# Patient Record
Sex: Female | Born: 1950 | Race: Black or African American | Hispanic: No | Marital: Single | Smoking: Never smoker
Health system: Southern US, Community
[De-identification: ages and names within clinical notes are randomized; demographics above are authoritative.]

## PROBLEM LIST (undated history)

## (undated) DIAGNOSIS — I7121 Aneurysm of the ascending aorta, without rupture: Secondary | ICD-10-CM

## (undated) DIAGNOSIS — E119 Type 2 diabetes mellitus without complications: Secondary | ICD-10-CM

## (undated) DIAGNOSIS — N189 Chronic kidney disease, unspecified: Secondary | ICD-10-CM

## (undated) DIAGNOSIS — Z972 Presence of dental prosthetic device (complete) (partial): Secondary | ICD-10-CM

## (undated) DIAGNOSIS — N2581 Secondary hyperparathyroidism of renal origin: Secondary | ICD-10-CM

## (undated) DIAGNOSIS — I639 Cerebral infarction, unspecified: Secondary | ICD-10-CM

## (undated) DIAGNOSIS — N186 End stage renal disease: Secondary | ICD-10-CM

## (undated) DIAGNOSIS — I10 Essential (primary) hypertension: Secondary | ICD-10-CM

## (undated) DIAGNOSIS — J811 Chronic pulmonary edema: Secondary | ICD-10-CM

## (undated) DIAGNOSIS — I509 Heart failure, unspecified: Secondary | ICD-10-CM

## (undated) DIAGNOSIS — J189 Pneumonia, unspecified organism: Secondary | ICD-10-CM

## (undated) DIAGNOSIS — I679 Cerebrovascular disease, unspecified: Secondary | ICD-10-CM

## (undated) DIAGNOSIS — E785 Hyperlipidemia, unspecified: Secondary | ICD-10-CM

## (undated) HISTORY — DX: Type 2 diabetes mellitus without complications: E11.9

## (undated) HISTORY — PX: ABDOMINAL HYSTERECTOMY: SHX81

## (undated) HISTORY — PX: COLONOSCOPY: SHX174

## (undated) HISTORY — DX: Essential (primary) hypertension: I10

---

## 2006-12-26 ENCOUNTER — Emergency Department: Payer: Self-pay | Admitting: Emergency Medicine

## 2006-12-26 ENCOUNTER — Other Ambulatory Visit: Payer: Self-pay

## 2012-11-26 DIAGNOSIS — E119 Type 2 diabetes mellitus without complications: Secondary | ICD-10-CM | POA: Insufficient documentation

## 2012-11-26 DIAGNOSIS — I679 Cerebrovascular disease, unspecified: Secondary | ICD-10-CM | POA: Insufficient documentation

## 2013-12-29 DIAGNOSIS — E785 Hyperlipidemia, unspecified: Secondary | ICD-10-CM | POA: Insufficient documentation

## 2014-12-12 ENCOUNTER — Encounter: Payer: Self-pay | Admitting: Podiatry

## 2014-12-12 ENCOUNTER — Ambulatory Visit (INDEPENDENT_AMBULATORY_CARE_PROVIDER_SITE_OTHER): Payer: No Typology Code available for payment source

## 2014-12-12 ENCOUNTER — Ambulatory Visit (INDEPENDENT_AMBULATORY_CARE_PROVIDER_SITE_OTHER): Payer: No Typology Code available for payment source | Admitting: Podiatry

## 2014-12-12 VITALS — BP 137/84 | HR 100 | Resp 16 | Ht 67.0 in | Wt 185.0 lb

## 2014-12-12 DIAGNOSIS — M79605 Pain in left leg: Secondary | ICD-10-CM | POA: Diagnosis not present

## 2014-12-12 DIAGNOSIS — E119 Type 2 diabetes mellitus without complications: Secondary | ICD-10-CM

## 2014-12-12 DIAGNOSIS — B351 Tinea unguium: Secondary | ICD-10-CM | POA: Diagnosis not present

## 2014-12-12 NOTE — Progress Notes (Signed)
   Subjective:    Patient ID: Dorothy Walton, female    DOB: Oct 21, 1950, 64 y.o.   MRN: [id number]  HPI The left 5th toenail is really long and thick. Can not cut it , i am diabetic and my last a1c was about a 12    Review of Systems  Endocrine:       Diabetes        Objective:   Physical Exam: I have reviewed her past medical history medications out of the surgery social history and review of systems. Pulses are strongly palpable bilateral. Neurologic system is intact percent once the monofilament. Deep tendon reflexes are intact bilateral muscle strength is 5 over 5 dorsiflexion plantar flexors and inverters everters on his musculature is intact. Orthopedic evaluation was resulted was distal to the ankle for range of motion or crepitation. Cutaneous evaluation inserted supple well-hydrated cutis thick yellow dystrophic clinically mycotic nail digit left foot is present. This is painful on palpation as well as debridement. The nail measures greater than 1 inch in length.        Assessment & Plan:  Assessment: Nail dystrophy with onychomycosis fifth digit left foot. Diabetes mellitus without complications.  Plan: Debridement of nails 1 through 5 left foot

## 2015-02-17 DIAGNOSIS — Z789 Other specified health status: Secondary | ICD-10-CM | POA: Insufficient documentation

## 2016-05-01 ENCOUNTER — Encounter: Payer: Self-pay | Admitting: Podiatry

## 2016-05-01 ENCOUNTER — Ambulatory Visit (INDEPENDENT_AMBULATORY_CARE_PROVIDER_SITE_OTHER): Payer: Managed Care, Other (non HMO) | Admitting: Podiatry

## 2016-05-01 DIAGNOSIS — B351 Tinea unguium: Secondary | ICD-10-CM | POA: Diagnosis not present

## 2016-05-01 DIAGNOSIS — M79676 Pain in unspecified toe(s): Secondary | ICD-10-CM

## 2016-05-02 NOTE — Progress Notes (Signed)
She presents today with a chief complaint of painful fifth digital nail left foot. She states that should've Q remove it last time permanently but I thought it would grow back better.  Objective: Vital signs are stable she is alert and oriented 3 states that her hemoglobin A1c has recently been a 10. Pulses remain palpable fifth digit left foot demonstrates a very thick discolored painful hard nail left as all the other nails appear to be normal. No signs of open wounds.  Assessment: Pain limb secondary to onychomycosis fifth digit left foot.  Plan: Debridement of toenails 1 through 5 left foot.

## 2016-11-14 ENCOUNTER — Ambulatory Visit: Payer: Managed Care, Other (non HMO) | Admitting: Podiatry

## 2016-11-25 ENCOUNTER — Ambulatory Visit (INDEPENDENT_AMBULATORY_CARE_PROVIDER_SITE_OTHER): Payer: Medicare HMO | Admitting: Podiatry

## 2016-11-25 DIAGNOSIS — E119 Type 2 diabetes mellitus without complications: Secondary | ICD-10-CM

## 2016-11-25 DIAGNOSIS — B351 Tinea unguium: Secondary | ICD-10-CM | POA: Diagnosis not present

## 2016-11-25 DIAGNOSIS — M79676 Pain in unspecified toe(s): Secondary | ICD-10-CM | POA: Diagnosis not present

## 2016-11-25 NOTE — Progress Notes (Signed)
Complaint:  Visit Type: Patient returns to my office for continued preventative foot care services. Complaint: Patient states" my nails have grown long and thick and become painful to walk and wear shoes" Patient has been diagnosed with DM with neuropathy. The patient presents for preventative foot care services. No changes to ROS  Podiatric Exam: Vascular: dorsalis pedis and posterior tibial pulses are palpable bilateral. Capillary return is immediate. Temperature gradient is WNL. Skin turgor WNL  Sensorium: Diminished  Semmes Weinstein monofilament test. Normal tactile sensation bilaterally. Nail Exam: Pt has thick disfigured discolored nails with subungual debris noted fifth toenail left foot. Ulcer Exam: There is no evidence of ulcer or pre-ulcerative changes or infection. Orthopedic Exam: Muscle tone and strength are WNL. No limitations in general ROM. No crepitus or effusions noted. Foot type and digits show no abnormalities. Bony prominences are unremarkable. Skin: No Porokeratosis. No infection or ulcers  Diagnosis:  Onychomycosis   Treatment & Plan Procedures and Treatment: Consent by patient was obtained for treatment procedures. The patient understood the discussion of treatment and procedures well. All questions were answered thoroughly reviewed. Debridement of mycotic and hypertrophic toenails, 1 through 5 bilateral and clearing of subungual debris. No ulceration, no infection noted.  Return Visit-Office Procedure: Patient instructed to return to the office for a follow up visit 4 months for continued evaluation and treatment.    Gardiner Barefoot DPM

## 2017-03-31 ENCOUNTER — Ambulatory Visit: Payer: Medicare HMO | Admitting: Podiatry

## 2017-03-31 ENCOUNTER — Encounter: Payer: Self-pay | Admitting: Podiatry

## 2017-03-31 ENCOUNTER — Ambulatory Visit (INDEPENDENT_AMBULATORY_CARE_PROVIDER_SITE_OTHER): Payer: Medicare HMO | Admitting: Podiatry

## 2017-03-31 VITALS — BP 130/75 | HR 68 | Resp 16

## 2017-03-31 DIAGNOSIS — M79676 Pain in unspecified toe(s): Secondary | ICD-10-CM | POA: Diagnosis not present

## 2017-03-31 DIAGNOSIS — E119 Type 2 diabetes mellitus without complications: Secondary | ICD-10-CM

## 2017-03-31 DIAGNOSIS — B351 Tinea unguium: Secondary | ICD-10-CM | POA: Diagnosis not present

## 2017-03-31 NOTE — Progress Notes (Signed)
Complaint:  Visit Type: Patient returns to my office for continued preventative foot care services. Complaint: Patient states" my nails have grown long and thick and become painful to walk and wear shoes" Patient has been diagnosed with DM with neuropathy. The patient presents for preventative foot care services. No changes to ROS  Podiatric Exam: Vascular: dorsalis pedis and posterior tibial pulses are palpable bilateral. Capillary return is immediate. Temperature gradient is WNL. Skin turgor WNL  Sensorium: Diminished  Semmes Weinstein monofilament test. Normal tactile sensation bilaterally. Nail Exam: Pt has thick disfigured discolored nails with subungual debris noted fifth toenail left foot. Ulcer Exam: There is no evidence of ulcer or pre-ulcerative changes or infection. Orthopedic Exam: Muscle tone and strength are WNL. No limitations in general ROM. No crepitus or effusions noted. Foot type and digits show no abnormalities. Bony prominences are unremarkable. Skin: No Porokeratosis. No infection or ulcers  Diagnosis:  Onychomycosis   Treatment & Plan Procedures and Treatment: Consent by patient was obtained for treatment procedures. The patient understood the discussion of treatment and procedures well. All questions were answered thoroughly reviewed. Debridement of mycotic and hypertrophic toenails, 1 through 5 bilateral and clearing of subungual debris. No ulceration, no infection noted.  Return Visit-Office Procedure: Patient instructed to return to the office for a follow up visit 4 months for continued evaluation and treatment.    Gardiner Barefoot DPM

## 2017-07-31 ENCOUNTER — Ambulatory Visit: Payer: Medicare HMO | Admitting: Podiatry

## 2017-08-25 ENCOUNTER — Encounter: Payer: Self-pay | Admitting: Podiatry

## 2017-08-25 ENCOUNTER — Ambulatory Visit (INDEPENDENT_AMBULATORY_CARE_PROVIDER_SITE_OTHER): Payer: Medicare HMO | Admitting: Podiatry

## 2017-08-25 DIAGNOSIS — E119 Type 2 diabetes mellitus without complications: Secondary | ICD-10-CM

## 2017-08-25 DIAGNOSIS — M79676 Pain in unspecified toe(s): Secondary | ICD-10-CM

## 2017-08-25 DIAGNOSIS — B351 Tinea unguium: Secondary | ICD-10-CM | POA: Diagnosis not present

## 2017-08-25 NOTE — Progress Notes (Signed)
Complaint:  Visit Type: Patient returns to my office for continued preventative foot care services. Complaint: Patient states" my nails have grown long and thick and become painful to walk and wear shoes" Patient has been diagnosed with DM with neuropathy. The patient presents for preventative foot care services. No changes to ROS  Podiatric Exam: Vascular: dorsalis pedis and posterior tibial pulses are palpable bilateral. Capillary return is immediate. Temperature gradient is WNL. Skin turgor WNL  Sensorium: Diminished  Semmes Weinstein monofilament test. Normal tactile sensation bilaterally. Nail Exam: Pt has thick disfigured discolored nails with subungual debris especially fifth toenail left foot. Ulcer Exam: There is no evidence of ulcer or pre-ulcerative changes or infection. Orthopedic Exam: Muscle tone and strength are WNL. No limitations in general ROM. No crepitus or effusions noted. Adducto-varus fifth toes  B/L Bony prominences are unremarkable. Skin: No Porokeratosis. No infection or ulcers  Diagnosis:  Onychomycosis   Treatment & Plan Procedures and Treatment: Consent by patient was obtained for treatment procedures. The patient understood the discussion of treatment and procedures well. All questions were answered thoroughly reviewed. Debridement of mycotic and hypertrophic toenails, 1 through 5 bilateral and clearing of subungual debris. No ulceration, no infection noted. To consider nail surgery fifth toenail. Return Visit-Office Procedure: Patient instructed to return to the office for a follow up visit 3 months for continued evaluation and treatment.    Gardiner Barefoot DPM

## 2017-11-24 ENCOUNTER — Ambulatory Visit: Payer: Medicare HMO | Admitting: Podiatry

## 2017-12-08 ENCOUNTER — Ambulatory Visit: Payer: Medicare HMO | Admitting: Podiatry

## 2017-12-24 ENCOUNTER — Other Ambulatory Visit: Payer: Self-pay

## 2017-12-24 ENCOUNTER — Emergency Department
Admission: EM | Admit: 2017-12-24 | Discharge: 2017-12-25 | Disposition: A | Discharge status: Home / Self Care | Payer: Medicare HMO | Attending: Emergency Medicine | Admitting: Emergency Medicine

## 2017-12-24 ENCOUNTER — Emergency Department: Payer: Medicare HMO

## 2017-12-24 DIAGNOSIS — Z7982 Long term (current) use of aspirin: Secondary | ICD-10-CM | POA: Diagnosis not present

## 2017-12-24 DIAGNOSIS — I10 Essential (primary) hypertension: Secondary | ICD-10-CM | POA: Insufficient documentation

## 2017-12-24 DIAGNOSIS — Z7984 Long term (current) use of oral hypoglycemic drugs: Secondary | ICD-10-CM | POA: Insufficient documentation

## 2017-12-24 DIAGNOSIS — E119 Type 2 diabetes mellitus without complications: Secondary | ICD-10-CM | POA: Insufficient documentation

## 2017-12-24 DIAGNOSIS — R4182 Altered mental status, unspecified: Secondary | ICD-10-CM | POA: Insufficient documentation

## 2017-12-24 DIAGNOSIS — R5383 Other fatigue: Secondary | ICD-10-CM | POA: Diagnosis present

## 2017-12-24 LAB — GLUCOSE, CAPILLARY: GLUCOSE-CAPILLARY: 220 mg/dL — AB (ref 65–99)

## 2017-12-24 MED ORDER — CARVEDILOL 25 MG PO TABS
25.0000 mg | ORAL_TABLET | Freq: Two times a day (BID) | ORAL | Status: DC
  Administered 2017-12-25: 25 mg via ORAL

## 2017-12-24 NOTE — ED Notes (Signed)
Patient takes Coreg 25mg  bid, Amlodipine 10mg  daily, Chlorthalidone 25mg  daily for BP.
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
## 2017-12-25 ENCOUNTER — Ambulatory Visit (INDEPENDENT_AMBULATORY_CARE_PROVIDER_SITE_OTHER): Payer: Medicare HMO | Admitting: Podiatry

## 2017-12-25 ENCOUNTER — Encounter: Payer: Self-pay | Admitting: Podiatry

## 2017-12-25 DIAGNOSIS — E119 Type 2 diabetes mellitus without complications: Secondary | ICD-10-CM | POA: Diagnosis not present

## 2017-12-25 DIAGNOSIS — B351 Tinea unguium: Secondary | ICD-10-CM | POA: Diagnosis not present

## 2017-12-25 DIAGNOSIS — M79676 Pain in unspecified toe(s): Secondary | ICD-10-CM | POA: Diagnosis not present

## 2017-12-25 MED ORDER — CARVEDILOL 25 MG PO TABS
ORAL_TABLET | ORAL | Status: AC
  Administered 2017-12-25: 25 mg via ORAL
  Filled 2017-12-25: qty 1

## 2017-12-25 NOTE — Progress Notes (Signed)
Complaint:  Visit Type: Patient returns to my office for continued preventative foot care services. Complaint: Patient states" my nails have grown long and thick and become painful to walk and wear shoes" Patient has been diagnosed with DM with neuropathy. The patient presents for preventative foot care services. No changes to ROS  Podiatric Exam: Vascular: dorsalis pedis and posterior tibial pulses are palpable bilateral. Capillary return is immediate. Temperature gradient is WNL. Skin turgor WNL  Sensorium: Diminished  Semmes Weinstein monofilament test. Normal tactile sensation bilaterally. Nail Exam: Pt has thick disfigured discolored nails with subungual debris especially fifth toenail left foot. Ulcer Exam: There is no evidence of ulcer or pre-ulcerative changes or infection. Orthopedic Exam: Muscle tone and strength are WNL. No limitations in general ROM. No crepitus or effusions noted. Adducto-varus fifth toes  B/L Bony prominences are unremarkable. Skin: No Porokeratosis. No infection or ulcers  Diagnosis:  Onychomycosis   Treatment & Plan Procedures and Treatment: Consent by patient was obtained for treatment procedures. The patient understood the discussion of treatment and procedures well. All questions were answered thoroughly reviewed. Debridement of mycotic and hypertrophic toenails, 1 through 5 bilateral and clearing of subungual debris. No ulceration, no infection noted. To consider nail surgery fifth toenail. Return Visit-Office Procedure: Patient instructed to return to the office for a follow up visit 3 months for continued evaluation and treatment.    Gardiner Barefoot DPM
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
## 2018-03-02 ENCOUNTER — Ambulatory Visit: Payer: Medicare HMO | Admitting: Podiatry

## 2018-03-09 ENCOUNTER — Ambulatory Visit (INDEPENDENT_AMBULATORY_CARE_PROVIDER_SITE_OTHER): Payer: Medicare HMO | Admitting: Podiatry

## 2018-03-09 ENCOUNTER — Encounter: Payer: Self-pay | Admitting: Podiatry

## 2018-03-09 DIAGNOSIS — B351 Tinea unguium: Secondary | ICD-10-CM | POA: Diagnosis not present

## 2018-03-09 DIAGNOSIS — M79676 Pain in unspecified toe(s): Secondary | ICD-10-CM | POA: Diagnosis not present

## 2018-03-09 DIAGNOSIS — E119 Type 2 diabetes mellitus without complications: Secondary | ICD-10-CM

## 2018-03-09 NOTE — Progress Notes (Signed)
Complaint:  Visit Type: Patient returns to my office for continued preventative foot care services. Complaint: Patient states" my nails have grown long and thick and become painful to walk and wear shoes" Patient has been diagnosed with DM with neuropathy. The patient presents for preventative foot care services. No changes to ROS  Podiatric Exam: Vascular: dorsalis pedis and posterior tibial pulses are palpable bilateral. Capillary return is immediate. Temperature gradient is WNL. Skin turgor WNL  Sensorium: Diminished  Semmes Weinstein monofilament test. Normal tactile sensation bilaterally. Nail Exam: Pt has thick disfigured discolored nails with subungual debris especially fifth toenail left foot. Ulcer Exam: There is no evidence of ulcer or pre-ulcerative changes or infection. Orthopedic Exam: Muscle tone and strength are WNL. No limitations in general ROM. No crepitus or effusions noted. Adducto-varus fifth toes  B/L Bony prominences are unremarkable. Skin: No Porokeratosis. No infection or ulcers  Diagnosis:  Onychomycosis   Treatment & Plan Procedures and Treatment: Consent by patient was obtained for treatment procedures. The patient understood the discussion of treatment and procedures well. All questions were answered thoroughly reviewed. Debridement of mycotic and hypertrophic toenails, 1 through 5 bilateral and clearing of subungual debris. No ulceration, no infection noted.  Return Visit-Office Procedure: Patient instructed to return to the office for a follow up visit 3 months for continued evaluation and treatment.    Gardiner Barefoot DPM

## 2018-06-11 ENCOUNTER — Ambulatory Visit: Payer: Medicare HMO | Admitting: Podiatry

## 2018-06-25 ENCOUNTER — Ambulatory Visit (INDEPENDENT_AMBULATORY_CARE_PROVIDER_SITE_OTHER): Payer: Medicare HMO | Admitting: Podiatry

## 2018-06-25 ENCOUNTER — Encounter: Payer: Self-pay | Admitting: Podiatry

## 2018-06-25 DIAGNOSIS — M79676 Pain in unspecified toe(s): Secondary | ICD-10-CM | POA: Diagnosis not present

## 2018-06-25 DIAGNOSIS — B351 Tinea unguium: Secondary | ICD-10-CM | POA: Diagnosis not present

## 2018-06-25 DIAGNOSIS — E119 Type 2 diabetes mellitus without complications: Secondary | ICD-10-CM

## 2018-06-25 NOTE — Progress Notes (Signed)
Complaint:  Visit Type: Patient returns to my office for continued preventative foot care services. Complaint: Patient states" my nails have grown long and thick and become painful to walk and wear shoes" Patient has been diagnosed with DM with neuropathy. The patient presents for preventative foot care services. No changes to ROS  Podiatric Exam: Vascular: dorsalis pedis and posterior tibial pulses are palpable bilateral. Capillary return is immediate. Temperature gradient is WNL. Skin turgor WNL  Sensorium: Diminished  Semmes Weinstein monofilament test. Normal tactile sensation bilaterally. Nail Exam: Pt has thick disfigured discolored nails with subungual debris especially fifth toenail left foot. Ulcer Exam: There is no evidence of ulcer or pre-ulcerative changes or infection. Orthopedic Exam: Muscle tone and strength are WNL. No limitations in general ROM. No crepitus or effusions noted. Adducto-varus fifth toes  B/L Bony prominences are unremarkable. Skin: No Porokeratosis. No infection or ulcers  Diagnosis:  Onychomycosis   Treatment & Plan Procedures and Treatment: Consent by patient was obtained for treatment procedures. The patient understood the discussion of treatment and procedures well. All questions were answered thoroughly reviewed. Debridement of mycotic and hypertrophic toenails, 1 through 5 bilateral and clearing of subungual debris. No ulceration, no infection noted.  Return Visit-Office Procedure: Patient instructed to return to the office for a follow up visit 3 months for continued evaluation and treatment.    Gardiner Barefoot DPM

## 2018-09-24 ENCOUNTER — Ambulatory Visit (INDEPENDENT_AMBULATORY_CARE_PROVIDER_SITE_OTHER): Payer: Medicare HMO | Admitting: Podiatry

## 2018-09-24 ENCOUNTER — Encounter: Payer: Self-pay | Admitting: Podiatry

## 2018-09-24 DIAGNOSIS — M79676 Pain in unspecified toe(s): Secondary | ICD-10-CM

## 2018-09-24 DIAGNOSIS — E119 Type 2 diabetes mellitus without complications: Secondary | ICD-10-CM

## 2018-09-24 DIAGNOSIS — B351 Tinea unguium: Secondary | ICD-10-CM | POA: Diagnosis not present

## 2018-09-24 NOTE — Progress Notes (Signed)
Complaint:  Visit Type: Patient returns to my office for continued preventative foot care services. Complaint: Patient states" my nails have grown long and thick and become painful to walk and wear shoes" Patient has been diagnosed with DM with neuropathy. The patient presents for preventative foot care services. No changes to ROS  Podiatric Exam: Vascular: dorsalis pedis and posterior tibial pulses are palpable bilateral. Capillary return is immediate. Temperature gradient is WNL. Skin turgor WNL  Sensorium: Diminished  Semmes Weinstein monofilament test. Normal tactile sensation bilaterally. Nail Exam: Pt has thick disfigured discolored nails with subungual debris especially fifth toenail left foot. Ulcer Exam: There is no evidence of ulcer or pre-ulcerative changes or infection. Orthopedic Exam: Muscle tone and strength are WNL. No limitations in general ROM. No crepitus or effusions noted. Adducto-varus fifth toes  B/L Bony prominences are unremarkable. Skin: No Porokeratosis. No infection or ulcers  Diagnosis:  Onychomycosis   Treatment & Plan Procedures and Treatment: Consent by patient was obtained for treatment procedures. The patient understood the discussion of treatment and procedures well. All questions were answered thoroughly reviewed. Debridement of mycotic and hypertrophic toenails, 1 through 5 bilateral and clearing of subungual debris. No ulceration, no infection noted.  Return Visit-Office Procedure: Patient instructed to return to the office for a follow up visit 3 months for continued evaluation and treatment.    Gardiner Barefoot DPM

## 2018-09-28 ENCOUNTER — Encounter: Payer: Self-pay | Admitting: *Deleted

## 2018-09-28 ENCOUNTER — Other Ambulatory Visit: Payer: Self-pay

## 2018-09-29 ENCOUNTER — Encounter: Payer: Self-pay | Admitting: Anesthesiology

## 2018-10-01 NOTE — Discharge Instructions (Signed)

## 2018-10-05 ENCOUNTER — Encounter
Admission: RE | Disposition: A | Discharge status: Home / Self Care | Payer: Self-pay | Source: Home / Self Care | Attending: Ophthalmology

## 2018-10-05 ENCOUNTER — Ambulatory Visit
Admission: RE | Admit: 2018-10-05 | Discharge: 2018-10-05 | Disposition: A | Discharge status: Home / Self Care | Payer: Medicare HMO | Attending: Ophthalmology | Admitting: Ophthalmology

## 2018-10-05 DIAGNOSIS — E1136 Type 2 diabetes mellitus with diabetic cataract: Secondary | ICD-10-CM | POA: Diagnosis present

## 2018-10-05 DIAGNOSIS — Z5309 Procedure and treatment not carried out because of other contraindication: Secondary | ICD-10-CM | POA: Insufficient documentation

## 2018-10-05 HISTORY — DX: Presence of dental prosthetic device (complete) (partial): Z97.2

## 2018-10-05 HISTORY — DX: Cerebral infarction, unspecified: I63.9

## 2018-10-05 HISTORY — DX: Hyperlipidemia, unspecified: E78.5

## 2018-10-05 LAB — GLUCOSE, CAPILLARY: Glucose-Capillary: 103 mg/dL — ABNORMAL HIGH (ref 70–99)

## 2018-10-05 SURGERY — PHACOEMULSIFICATION, CATARACT, WITH IOL INSERTION
Anesthesia: Topical | Laterality: Left

## 2018-10-05 MED ORDER — TETRACAINE HCL 0.5 % OP SOLN: 1.0000 [drp] | OPHTHALMIC | Status: DC | PRN

## 2018-10-05 MED ORDER — ARMC OPHTHALMIC DILATING DROPS: 1.0000 "application " | OPHTHALMIC | Status: DC | PRN

## 2018-10-05 MED ORDER — LACTATED RINGERS IV SOLN: INTRAVENOUS | Status: DC

## 2018-10-05 SURGICAL SUPPLY — 16 items
CANNULA ANT/CHMB 27GA (MISCELLANEOUS) ×6 IMPLANT
DISSECTOR HYDRO NUCLEUS 50X22 (MISCELLANEOUS) ×3 IMPLANT
GLOVE SURG LX 7.5 STRW (GLOVE) ×2
GLOVE SURG LX STRL 7.5 STRW (GLOVE) ×1 IMPLANT
GLOVE SURG SYN 8.5  E (GLOVE) ×2
GLOVE SURG SYN 8.5 E (GLOVE) ×1 IMPLANT
GOWN STRL REUS W/ TWL LRG LVL3 (GOWN DISPOSABLE) ×2 IMPLANT
GOWN STRL REUS W/TWL LRG LVL3 (GOWN DISPOSABLE) ×4
MARKER SKIN DUAL TIP RULER LAB (MISCELLANEOUS) ×3 IMPLANT
PACK DR. KING ARMS (PACKS) ×3 IMPLANT
PACK EYE AFTER SURG (MISCELLANEOUS) ×3 IMPLANT
PACK OPTHALMIC (MISCELLANEOUS) ×3 IMPLANT
SYR 3ML LL SCALE MARK (SYRINGE) ×3 IMPLANT
SYR TB 1ML LUER SLIP (SYRINGE) ×3 IMPLANT
WATER STERILE IRR 500ML POUR (IV SOLUTION) ×3 IMPLANT
WIPE NON LINTING 3.25X3.25 (MISCELLANEOUS) ×3 IMPLANT

## 2018-10-05 NOTE — Progress Notes (Signed)
Patient canceled due to high blood pressure.

## 2018-10-05 NOTE — H&P (Signed)
Patient cancelled by anesthesia, elevated BP and family issues.  Not seen by me.   Dorothy Walton

## 2018-10-08 IMAGING — CT CT HEAD W/O CM
3 series · 16 of 45 positions shown, 19 images · non-contrast
Comparison: Head CT 12/26/2006

CLINICAL DATA: Altered level of consciousness.

EXAM:
CT HEAD WITHOUT CONTRAST
TECHNIQUE: Contiguous axial images were obtained from the base of the skull
through the vertex without intravenous contrast.

[Series 3: head wo · axial · 0.40mm/px · z∈[+774,+889]mm · 10 of 28 slices shown, 13 images]
[im 3/28  brain]
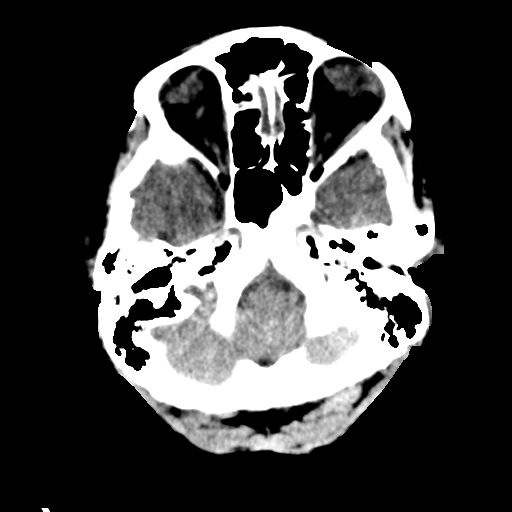
[im 3/28  bone]
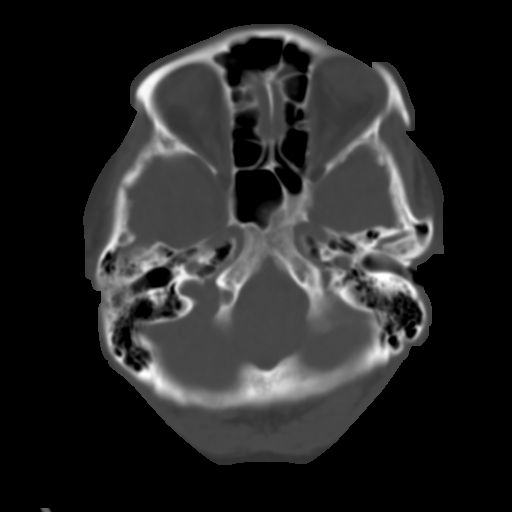
[im 5/28  brain]
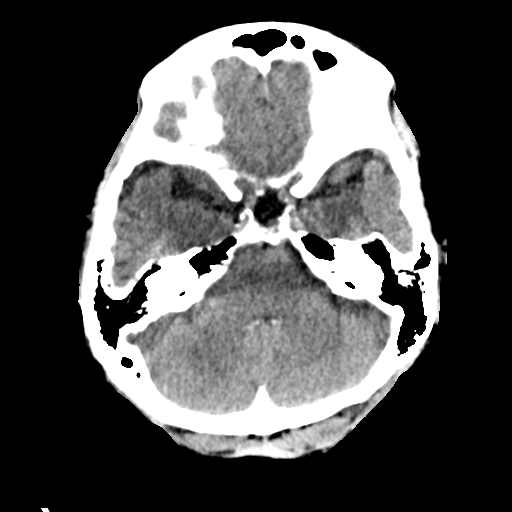
[im 8/28  brain]
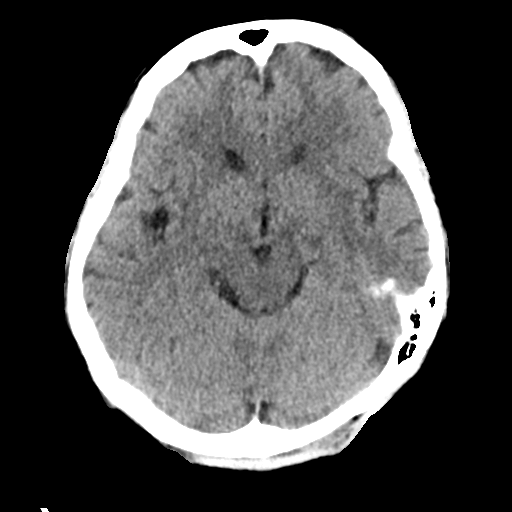
[im 11/28  brain]
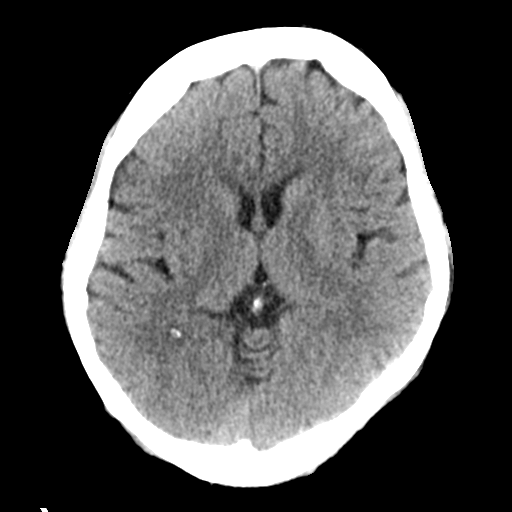
[im 13/28  brain]
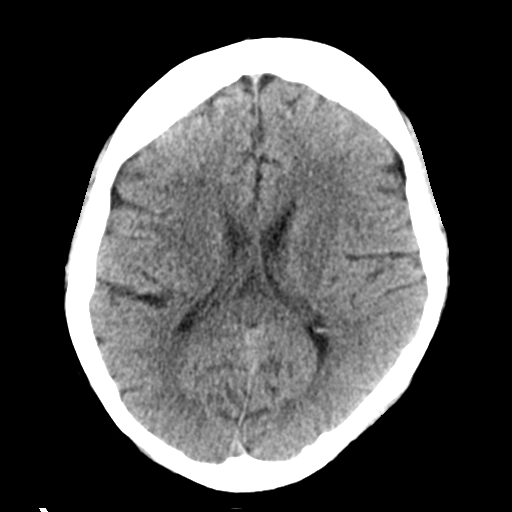
[im 13/28  bone]
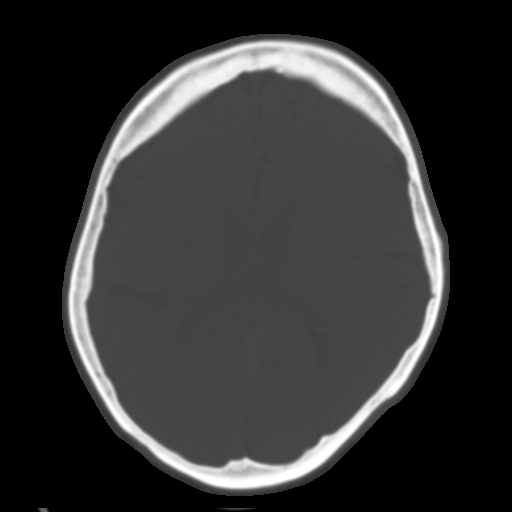
[im 16/28  brain]
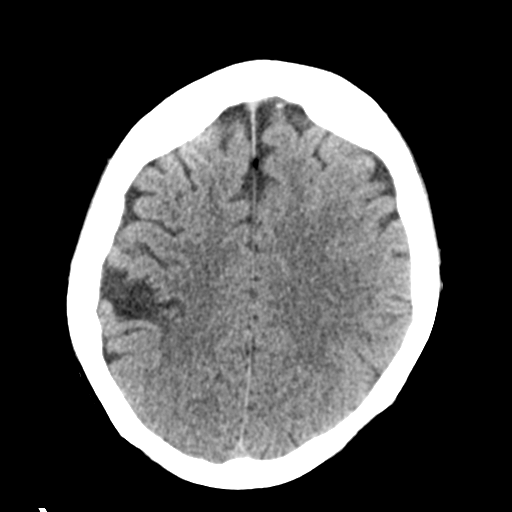
[im 18/28  brain]
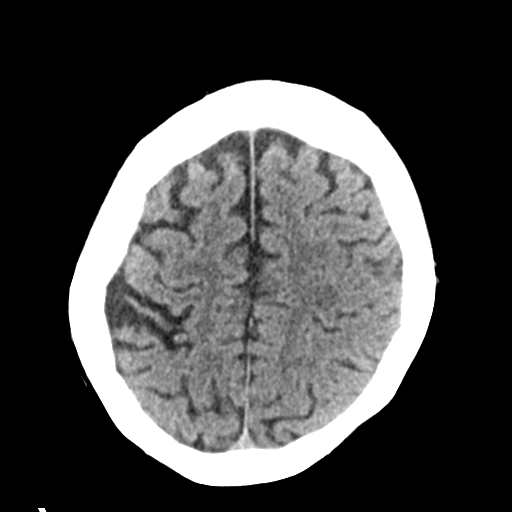
[im 21/28  brain]
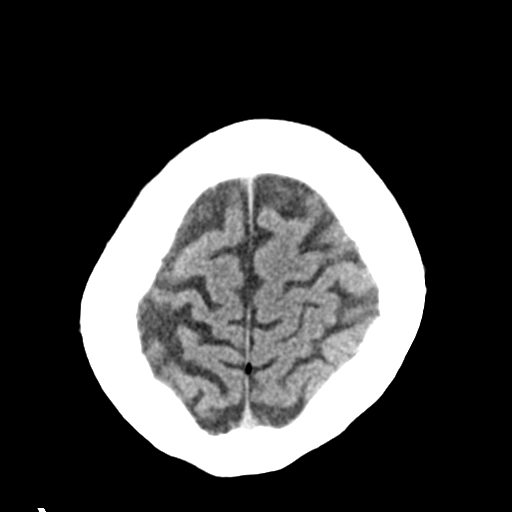
[im 24/28  brain]
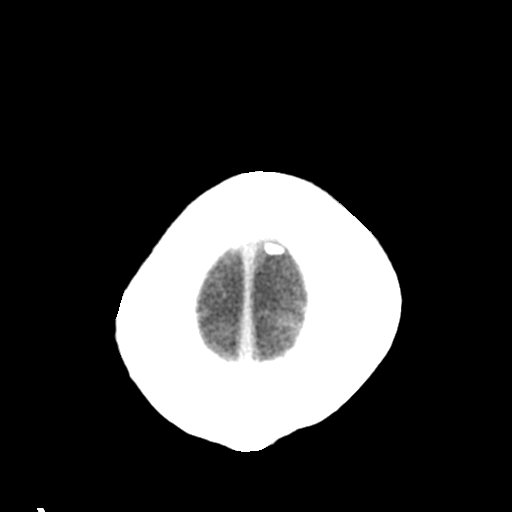
[im 24/28  bone]
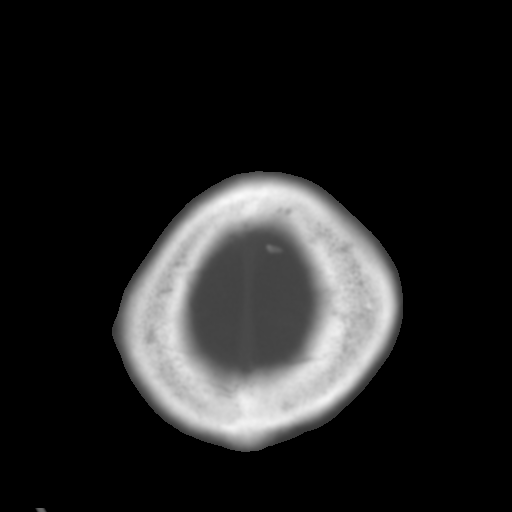
[im 26/28  brain]
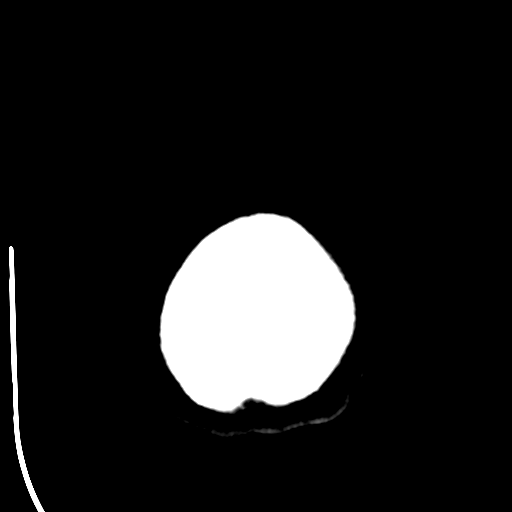

[Series 4: coronal soft tissue · coronal · 0.34mm/px · 3 of 65 slices shown]
[im 22/65  brain]
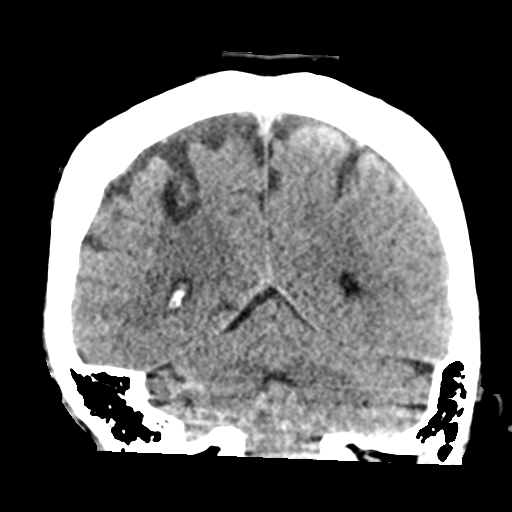
[im 29/65  brain]
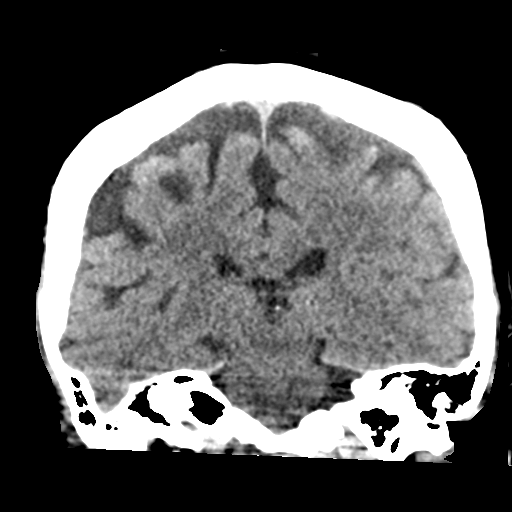
[im 36/65  brain]
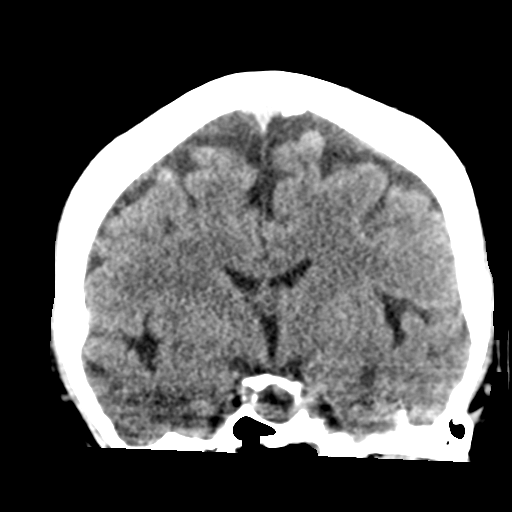

[Series 5: sagittal soft tissue · sagittal · 0.31mm/px · 3 of 51 slices shown]
[im 17/51  brain]
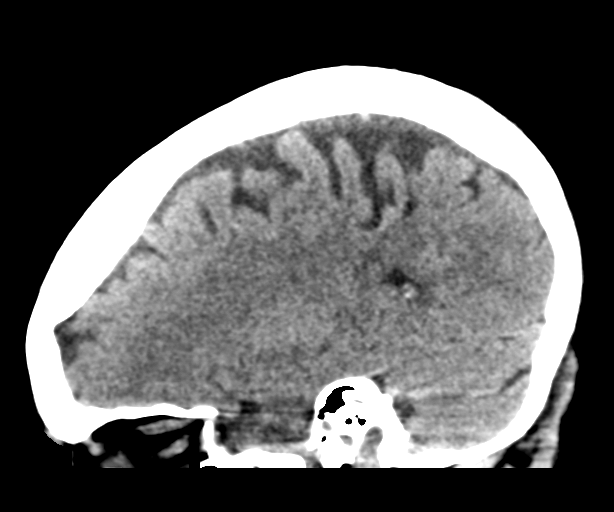
[im 26/51  brain]
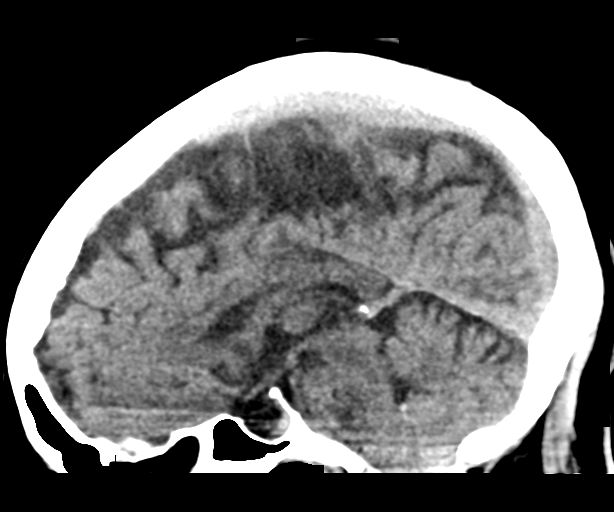
[im 34/51  brain]
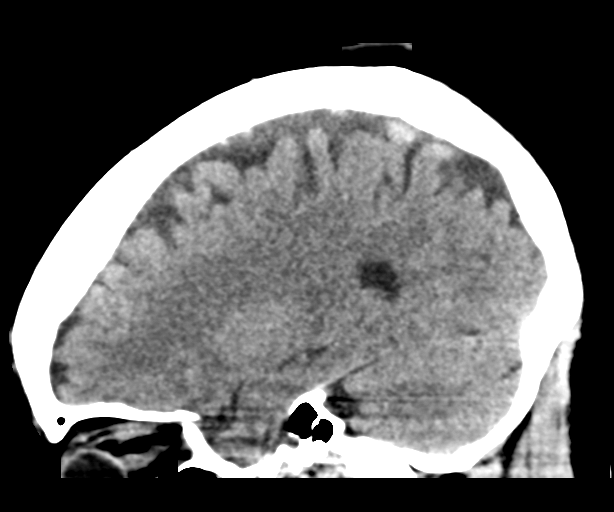

[16 of 45 positions shown; findings below may reference images not displayed]

FINDINGS: Brain: Remote posterior right parietal infarct is unchanged. No
intracranial hemorrhage, mass effect, or midline shift. No
hydrocephalus. Low lying cerebellar tonsils suspected. No evidence
of territorial infarct or acute ischemia. No extra-axial or
intracranial fluid collection.

Vascular: No hyperdense vessel or unexpected calcification.

Skull: No fracture or focal lesion.

Sinuses/Orbits: Paranasal sinuses and mastoid air cells are clear.
The visualized orbits are unremarkable.

Other: None.
IMPRESSION: 1.  No acute intracranial abnormality.
2. Small chronic posterior parietal infarct, unchanged.

## 2018-12-21 DIAGNOSIS — I2699 Other pulmonary embolism without acute cor pulmonale: Secondary | ICD-10-CM

## 2018-12-21 DIAGNOSIS — U071 COVID-19: Secondary | ICD-10-CM

## 2018-12-21 HISTORY — DX: COVID-19: U07.1

## 2018-12-21 HISTORY — DX: Other pulmonary embolism without acute cor pulmonale: I26.99

## 2018-12-24 ENCOUNTER — Ambulatory Visit: Payer: Medicare HMO | Admitting: Podiatry

## 2019-01-03 DIAGNOSIS — J189 Pneumonia, unspecified organism: Secondary | ICD-10-CM | POA: Insufficient documentation

## 2019-01-10 DIAGNOSIS — I82403 Acute embolism and thrombosis of unspecified deep veins of lower extremity, bilateral: Secondary | ICD-10-CM | POA: Insufficient documentation

## 2019-01-10 DIAGNOSIS — I214 Non-ST elevation (NSTEMI) myocardial infarction: Secondary | ICD-10-CM | POA: Insufficient documentation

## 2019-01-14 ENCOUNTER — Ambulatory Visit: Payer: Medicare HMO | Admitting: Podiatry

## 2019-01-20 DIAGNOSIS — I7121 Aneurysm of the ascending aorta, without rupture: Secondary | ICD-10-CM | POA: Insufficient documentation

## 2019-01-20 DIAGNOSIS — I712 Thoracic aortic aneurysm, without rupture: Secondary | ICD-10-CM | POA: Insufficient documentation

## 2019-01-25 ENCOUNTER — Ambulatory Visit: Admit: 2019-01-25 | Payer: Medicare HMO | Admitting: Ophthalmology

## 2019-01-25 SURGERY — PHACOEMULSIFICATION, CATARACT, WITH IOL INSERTION
Anesthesia: Topical | Laterality: Left

## 2019-02-22 ENCOUNTER — Other Ambulatory Visit: Payer: Self-pay

## 2019-02-22 ENCOUNTER — Encounter: Payer: Self-pay | Admitting: Podiatry

## 2019-02-22 ENCOUNTER — Ambulatory Visit: Payer: Medicare HMO | Admitting: Podiatry

## 2019-02-22 VITALS — Temp 97.9°F

## 2019-02-22 DIAGNOSIS — E119 Type 2 diabetes mellitus without complications: Secondary | ICD-10-CM | POA: Diagnosis not present

## 2019-02-22 DIAGNOSIS — M79676 Pain in unspecified toe(s): Secondary | ICD-10-CM

## 2019-02-22 DIAGNOSIS — B351 Tinea unguium: Secondary | ICD-10-CM | POA: Diagnosis not present

## 2019-02-22 NOTE — Progress Notes (Signed)
Complaint:  Visit Type: Patient returns to my office for continued preventative foot care services. Complaint: Patient states" my nails have grown long and thick and become painful to walk and wear shoes" Patient has been diagnosed with DM with neuropathy. The patient presents for preventative foot care services. No changes to ROS.  Patient was hospitalized with Covid.  Podiatric Exam: Vascular: dorsalis pedis and posterior tibial pulses are palpable bilateral. Capillary return is immediate. Temperature gradient is WNL. Skin turgor WNL  Sensorium: Diminished  Semmes Weinstein monofilament test. Normal tactile sensation bilaterally. Nail Exam: Pt has thick disfigured discolored nails with subungual debris especially fifth toenail left foot. Ulcer Exam: There is no evidence of ulcer or pre-ulcerative changes or infection. Orthopedic Exam: Muscle tone and strength are WNL. No limitations in general ROM. No crepitus or effusions noted. Adducto-varus fifth toes  B/L Bony prominences are unremarkable. Skin: No Porokeratosis. No infection or ulcers  Diagnosis:  Onychomycosis   Treatment & Plan Procedures and Treatment: Consent by patient was obtained for treatment procedures. The patient understood the discussion of treatment and procedures well. All questions were answered thoroughly reviewed. Debridement of mycotic and hypertrophic toenails, 1 through 5 bilateral and clearing of subungual debris. No ulceration, no infection noted.  Return Visit-Office Procedure: Patient instructed to return to the office for a follow up visit 3 months for continued evaluation and treatment.    Gardiner Barefoot DPM

## 2019-05-24 ENCOUNTER — Ambulatory Visit: Payer: Medicare HMO | Admitting: Podiatry

## 2019-05-27 ENCOUNTER — Ambulatory Visit: Payer: Medicare HMO | Admitting: Podiatry

## 2019-05-31 ENCOUNTER — Ambulatory Visit (INDEPENDENT_AMBULATORY_CARE_PROVIDER_SITE_OTHER): Payer: Medicare HMO | Admitting: Podiatry

## 2019-05-31 ENCOUNTER — Other Ambulatory Visit: Payer: Self-pay

## 2019-05-31 ENCOUNTER — Encounter: Payer: Self-pay | Admitting: Podiatry

## 2019-05-31 DIAGNOSIS — M79676 Pain in unspecified toe(s): Secondary | ICD-10-CM

## 2019-05-31 DIAGNOSIS — E119 Type 2 diabetes mellitus without complications: Secondary | ICD-10-CM | POA: Diagnosis not present

## 2019-05-31 DIAGNOSIS — B351 Tinea unguium: Secondary | ICD-10-CM | POA: Diagnosis not present

## 2019-05-31 NOTE — Progress Notes (Signed)
Complaint:  Visit Type: Patient returns to my office for continued preventative foot care services. Complaint: Patient states" my nails have grown long and thick and become painful to walk and wear shoes" Patient has been diagnosed with DM with neuropathy. The patient presents for preventative foot care services. No changes to ROS.  Patient was hospitalized with Covid.  Podiatric Exam: Vascular: dorsalis pedis and posterior tibial pulses are palpable bilateral. Capillary return is immediate. Temperature gradient is WNL. Skin turgor WNL  Sensorium: Diminished  Semmes Weinstein monofilament test. Normal tactile sensation bilaterally. Nail Exam: Pt has thick disfigured discolored nails with subungual debris especially fifth toenail left foot. Ulcer Exam: There is no evidence of ulcer or pre-ulcerative changes or infection. Orthopedic Exam: Muscle tone and strength are WNL. No limitations in general ROM. No crepitus or effusions noted. Adducto-varus fifth toes  B/L Bony prominences are unremarkable. Skin: No Porokeratosis. No infection or ulcers  Diagnosis:  Onychomycosis   Treatment & Plan Procedures and Treatment: Consent by patient was obtained for treatment procedures. The patient understood the discussion of treatment and procedures well. All questions were answered thoroughly reviewed. Debridement of mycotic and hypertrophic toenails, 1 through 5 bilateral and clearing of subungual debris. No ulceration, no infection noted.  Return Visit-Office Procedure: Patient instructed to return to the office for a follow up visit 3 months for continued evaluation and treatment.    Gardiner Barefoot DPM

## 2019-08-30 ENCOUNTER — Other Ambulatory Visit: Payer: Self-pay

## 2019-08-30 ENCOUNTER — Encounter: Payer: Self-pay | Admitting: Podiatry

## 2019-08-30 ENCOUNTER — Ambulatory Visit: Payer: Medicare HMO | Admitting: Podiatry

## 2019-08-30 DIAGNOSIS — M79676 Pain in unspecified toe(s): Secondary | ICD-10-CM | POA: Diagnosis not present

## 2019-08-30 DIAGNOSIS — E119 Type 2 diabetes mellitus without complications: Secondary | ICD-10-CM

## 2019-08-30 DIAGNOSIS — B351 Tinea unguium: Secondary | ICD-10-CM

## 2019-08-30 NOTE — Progress Notes (Signed)
Complaint:  Visit Type: Patient returns to my office for continued preventative foot care services. Complaint: Patient states" my nails have grown long and thick and become painful to walk and wear shoes" Patient has been diagnosed with DM with neuropathy. The patient presents for preventative foot care services. No changes to ROS.  Patient was hospitalized with Covid.  Podiatric Exam: Vascular: dorsalis pedis and posterior tibial pulses are palpable bilateral. Capillary return is immediate. Temperature gradient is WNL. Skin turgor WNL  Sensorium: Diminished  Semmes Weinstein monofilament test. Normal tactile sensation bilaterally. Nail Exam: Pt has thick disfigured discolored nails with subungual debris especially fifth toenail left foot. Ulcer Exam: There is no evidence of ulcer or pre-ulcerative changes or infection. Orthopedic Exam: Muscle tone and strength are WNL. No limitations in general ROM. No crepitus or effusions noted. Adducto-varus fifth toes  B/L Bony prominences are unremarkable. Skin: No Porokeratosis. No infection or ulcers  Diagnosis:  Onychomycosis   Treatment & Plan Procedures and Treatment: Consent by patient was obtained for treatment procedures. The patient understood the discussion of treatment and procedures well. All questions were answered thoroughly reviewed. Debridement of mycotic and hypertrophic toenails, 1 through 5 bilateral and clearing of subungual debris. No ulceration, no infection noted.  Return Visit-Office Procedure: Patient instructed to return to the office for a follow up visit 3 months for continued evaluation and treatment.    Gardiner Barefoot DPM

## 2019-11-01 ENCOUNTER — Other Ambulatory Visit: Payer: Self-pay

## 2019-11-01 ENCOUNTER — Emergency Department
Admission: EM | Admit: 2019-11-01 | Discharge: 2019-11-01 | Disposition: A | Discharge status: Home / Self Care | Payer: Medicare HMO | Attending: Student in an Organized Health Care Education/Training Program | Admitting: Student in an Organized Health Care Education/Training Program

## 2019-11-01 ENCOUNTER — Emergency Department: Payer: Medicare HMO

## 2019-11-01 ENCOUNTER — Ambulatory Visit
Admission: EM | Admit: 2019-11-01 | Discharge: 2019-11-01 | Disposition: A | Discharge status: Other Acute Inpatient Hospital | Payer: Medicare HMO | Attending: Emergency Medicine | Admitting: Emergency Medicine

## 2019-11-01 ENCOUNTER — Encounter: Payer: Self-pay | Admitting: Emergency Medicine

## 2019-11-01 DIAGNOSIS — Z79899 Other long term (current) drug therapy: Secondary | ICD-10-CM | POA: Diagnosis not present

## 2019-11-01 DIAGNOSIS — I160 Hypertensive urgency: Secondary | ICD-10-CM | POA: Insufficient documentation

## 2019-11-01 DIAGNOSIS — I10 Essential (primary) hypertension: Secondary | ICD-10-CM | POA: Diagnosis not present

## 2019-11-01 DIAGNOSIS — Z794 Long term (current) use of insulin: Secondary | ICD-10-CM | POA: Insufficient documentation

## 2019-11-01 DIAGNOSIS — Z7901 Long term (current) use of anticoagulants: Secondary | ICD-10-CM | POA: Insufficient documentation

## 2019-11-01 DIAGNOSIS — I159 Secondary hypertension, unspecified: Secondary | ICD-10-CM

## 2019-11-01 DIAGNOSIS — R9431 Abnormal electrocardiogram [ECG] [EKG]: Secondary | ICD-10-CM | POA: Insufficient documentation

## 2019-11-01 DIAGNOSIS — Z8673 Personal history of transient ischemic attack (TIA), and cerebral infarction without residual deficits: Secondary | ICD-10-CM | POA: Diagnosis not present

## 2019-11-01 DIAGNOSIS — E119 Type 2 diabetes mellitus without complications: Secondary | ICD-10-CM | POA: Insufficient documentation

## 2019-11-01 LAB — TROPONIN I (HIGH SENSITIVITY)
Troponin I (High Sensitivity): 27 ng/L — ABNORMAL HIGH (ref <18)
Troponin I (High Sensitivity): 24 ng/L — ABNORMAL HIGH (ref <18)

## 2019-11-01 LAB — BASIC METABOLIC PANEL
Anion gap: 8 (ref 5–15)
BUN: 38 mg/dL — ABNORMAL HIGH (ref 8–23)
CO2: 24 mmol/L (ref 22–32)
Calcium: 9.1 mg/dL (ref 8.9–10.3)
Chloride: 106 mmol/L (ref 98–111)
Creatinine, Ser: 1.7 mg/dL — ABNORMAL HIGH (ref 0.44–1.00)
GFR calc Af Amer: 36 mL/min — ABNORMAL LOW (ref >60)
GFR calc non Af Amer: 31 mL/min — ABNORMAL LOW (ref >60)
Glucose, Bld: 77 mg/dL (ref 70–99)
Potassium: 4.4 mmol/L (ref 3.5–5.1)
Sodium: 138 mmol/L (ref 135–145)

## 2019-11-01 LAB — CBC
HCT: 38.8 % (ref 36.0–46.0)
Hemoglobin: 11.5 g/dL — ABNORMAL LOW (ref 12.0–15.0)
MCH: 25.1 pg — ABNORMAL LOW (ref 26.0–34.0)
MCHC: 29.6 g/dL — ABNORMAL LOW (ref 30.0–36.0)
MCV: 84.7 fL (ref 80.0–100.0)
Platelets: 307 10*3/uL (ref 150–400)
RBC: 4.58 MIL/uL (ref 3.87–5.11)
RDW: 15.8 % — ABNORMAL HIGH (ref 11.5–15.5)
WBC: 11.5 10*3/uL — ABNORMAL HIGH (ref 4.0–10.5)
nRBC: 0 % (ref 0.0–0.2)

## 2019-11-01 MED ORDER — SODIUM CHLORIDE 0.9% FLUSH: 3.0000 mL | Freq: Once | INTRAVENOUS | Status: DC

## 2019-11-01 MED ORDER — AMLODIPINE BESYLATE 5 MG PO TABS
10.0000 mg | ORAL_TABLET | Freq: Once | ORAL | Status: AC
  Administered 2019-11-01: 10 mg via ORAL
  Filled 2019-11-01: qty 2

## 2019-11-01 NOTE — ED Triage Notes (Signed)
B{ elevated this morning at eye doctor, so patient went to Urgent care, who referred patient to ED due to EKG changes.  Patient is AAOx3.  Skin warm and dry. NAD

## 2019-11-01 NOTE — ED Provider Notes (Signed)
Roderic Palau    CSN: [id number] Arrival date & time: 11/01/19  1138      History   Chief Complaint Chief Complaint  Patient presents with  . Hypertension    HPI Sabriya Yono is a 69 y.o. female.   Patient presents with concern for elevated blood pressure.  She was at the eye doctor this morning; her blood pressure was 190/113; she was instructed to come here.  She denies focal weakness, chest pain, shortness of breath, nausea, diaphoresis, or other symptoms.  She feels anxious due to her blood pressure being up.  Patient has history of hypertension, diabetes, hyperlipidemia, CVA.    The history is provided by the patient.    Past Medical History:  Diagnosis Date  . Diabetes mellitus without complication ([place])   . Hyperlipidemia   . Hypertension   . Stroke [facility])    over 25 yrs ago.  No deficits  . Wears dentures    partial upper    Patient Active Problem List   Diagnosis Date Noted  . HLD (hyperlipidemia) 12/29/2013  . Artery disease, cerebral 11/26/2012  . Diabetes mellitus, type 2 ([place]) 11/26/2012  . Benign hypertension 03/19/2010    Past Surgical History:  Procedure Laterality Date  . ABDOMINAL HYSTERECTOMY    . COLONOSCOPY      OB History   No obstetric history on file.      Home Medications    Prior to Admission medications   Medication Sig Start Date End Date Taking? Authorizing Provider  amLODipine (NORVASC) 10 MG tablet Take by mouth. 01/20/19  Yes [provider]  apixaban (ELIQUIS) 5 MG TABS tablet Take by mouth. 01/20/19 11/01/19 Yes [provider]  aspirin EC 81 MG tablet Take by mouth. 06/05/17  Yes [provider]  B-D ULTRA-FINE 33 LANCETS MISC Test qd Use as directed.E 11.9 01/20/19  Yes [provider]  Blood Glucose Monitoring Suppl (FIFTY50 GLUCOSE METER 2.0) w/Device KIT Check BG once daily as needed Dx E11.9 01/20/19  Yes [provider]  Blood Glucose Monitoring Suppl (ONETOUCH VERIO)  w/Device KIT  01/21/19  Yes [provider]  carvedilol (COREG) 25 MG tablet Take 25 mg by mouth 2 (two) times daily. 06/17/18  Yes [provider]  chlorthalidone (HYGROTON) 25 MG tablet Take by mouth. 06/05/17  Yes [provider]  glipiZIDE (GLUCOTROL XL) 5 MG 24 hr tablet TAKE 1 TABLET BY MOUTH TWICE DAILY 06/17/18  Yes [provider]  glucose blood (PRECISION QID TEST) test strip Check BG twicec daily as needed Dx E11.9 01/20/19  Yes [provider]  Insulin Degludec (TRESIBA FLEXTOUCH) 200 UNIT/ML SOPN [The details of the medication are not available because there are pending changes by a home health clinician.] 01/20/19  Yes [provider]  Insulin Pen Needle (FIFTY50 PEN NEEDLES) 31G X 8 MM MISC by Does not apply route. 01/20/19  Yes [provider]  lisinopril (ZESTRIL) 20 MG tablet Take by mouth. 01/20/19  Yes [provider]  metFORMIN (GLUCOPHAGE) 1000 MG tablet Take by mouth. 01/20/19  Yes [provider]  metoprolol succinate (TOPROL-XL) 100 MG 24 hr tablet Take one tablet twice daily 12/29/13  Yes [provider]  Omega-3 1000 MG CAPS Take by mouth.   Yes [provider]  pravastatin (PRAVACHOL) 40 MG tablet Take by mouth. 06/05/18 11/01/19 Yes [provider]  spironolactone (ALDACTONE) 25 MG tablet Take by mouth. 06/05/18 11/01/19 Yes [provider]  acetaminophen (TYLENOL)  325 MG tablet Take by mouth. 01/19/19   [provider]  benzonatate (TESSALON) 100 MG capsule TAKE 1 CAPSULE BY MOUTH EVERY 6 HOURS AS NEEDED FOR COUGH 09/11/18   [provider]  insulin aspart (NOVOLOG) 100 UNIT/ML injection Inject into the skin 3 (three) times daily before meals.    [provider]  Insulin Glargine (LANTUS SOLOSTAR) 100 UNIT/ML Solostar Pen Inject into the skin. 01/29/17   [provider]    Family History Family History  Problem Relation Age of Onset   . Heart disease Mother   . Cancer Father     Social History Social History   Tobacco Use  . Smoking status: Never Smoker  . Smokeless tobacco: Never Used  Substance Use Topics  . Alcohol use: Not Currently    Alcohol/week: 0.0 standard drinks  . Drug use: Never     Allergies   Atorvastatin and Penicillins   Review of Systems Review of Systems  Constitutional: Negative for chills and fever.  HENT: Negative for ear pain and sore throat.   Eyes: Negative for pain and visual disturbance.  Respiratory: Negative for cough and shortness of breath.   Cardiovascular: Negative for chest pain and palpitations.  Gastrointestinal: Negative for abdominal pain and vomiting.  Genitourinary: Negative for dysuria and hematuria.  Musculoskeletal: Negative for arthralgias and back pain.  Skin: Negative for color change and rash.  Neurological: Negative for dizziness, seizures, syncope, facial asymmetry, speech difficulty, weakness, numbness and headaches.  All other systems reviewed and are negative.    Physical Exam Triage Vital Signs ED Triage Vitals  Enc Vitals Group     BP      Pulse      Resp      Temp      Temp src      SpO2      Weight      Height      Head Circumference      Peak Flow      Pain Score      Pain Loc      Pain Edu?      Excl. in [place]?    No data found.  Updated Vital Signs BP (!) 176/111 (BP Location: Left Arm)   Pulse 69   Temp 98.1 F (36.7 C) (Oral)   Resp 18   Ht _0  (1.676 m)   Wt 190 lb (86.2 kg)   SpO2 93%   BMI 30.67 kg/m   Visual Acuity Right Eye Distance:   Left Eye Distance:   Bilateral Distance:    Right Eye Near:   Left Eye Near:    Bilateral Near:     Physical Exam Vitals and nursing note reviewed.  Constitutional:      General: She is not in acute distress.    Appearance: She is well-developed. She is not ill-appearing.  HENT:     Head: Normocephalic and atraumatic.     Mouth/Throat:     Mouth: Mucous membranes  are moist.     Pharynx: Oropharynx is clear.  Eyes:     Conjunctiva/sclera: Conjunctivae normal.  Cardiovascular:     Rate and Rhythm: Normal rate and regular rhythm.     Heart sounds: Normal heart sounds. No murmur.  Pulmonary:     Effort: Pulmonary effort is normal. No respiratory distress.     Breath sounds: Normal breath sounds.  Abdominal:     Palpations: Abdomen is soft.     Tenderness: There is  no abdominal tenderness. There is no guarding or rebound.  Musculoskeletal:     Cervical back: Neck supple.     Right lower leg: No edema.     Left lower leg: No edema.  Skin:    General: Skin is warm and dry.     Findings: No rash.  Neurological:     General: No focal deficit present.     Mental Status: She is alert and oriented to person, place, and time.     Cranial Nerves: No cranial nerve deficit.     Sensory: No sensory deficit.     Motor: No weakness.     Coordination: Coordination normal.     Gait: Gait normal.  Psychiatric:        Mood and Affect: Mood normal.        Behavior: Behavior normal.      UC Treatments / Results  Labs (all labs ordered are listed, but only abnormal results are displayed) Labs Reviewed - No data to display  EKG   Radiology No results found.  Procedures Procedures (including critical care time)  Medications Ordered in UC Medications - No data to display  Initial Impression / Assessment and Plan / UC Course  I have reviewed the triage vital signs and the nursing notes.  Pertinent labs & imaging results that were available during my care of the patient were reviewed by me and considered in my medical decision making (see chart for details).   Hypertensive Urgency. Abnormal EKG.  EKG shows rate 71, ST elevation in V2, Negative T waves in I, aVL, V5, V6; compared to previous in 2008.  Sending patient to ED for evaluation.  She refuses EMS.  Her cousin drove her here and will drive her to the ED.       Final Clinical  Impressions(s) / UC Diagnoses   Final diagnoses:  Hypertensive urgency  Abnormal EKG     Discharge Instructions     Go to the Emergency Department right away for evaluation of your very high blood pressure and changes on your EKG.        ED Prescriptions    None     PDMP not reviewed this encounter.   Sharion Balloon, NP 11/01/19 1216

## 2019-11-01 NOTE — Discharge Instructions (Addendum)
Go to the Emergency Department right away for evaluation of your very high blood pressure and changes on your EKG.

## 2019-11-01 NOTE — ED Provider Notes (Signed)
[facility] Emergency Department Provider Note    First MD Initiated Contact with Patient 11/01/19 1615     (approximate)  I have reviewed the triage vital signs and the nursing notes.   HISTORY  Chief Complaint Hypertension    HPI Dorothy Walton is a 69 y.o. female close past medical history presents the ER for evaluation of high blood pressure.  Patient was scheduled for outpatient cataract surgery at [facility] today noted that her blood pressure was elevated.  Was then directed to the urgent care for evaluation of her blood pressure and directed the ER.    Past Medical History:  Diagnosis Date  . Diabetes mellitus without complication ([place])   . Hyperlipidemia   . Hypertension   . Stroke [facility])    over 25 yrs ago.  No deficits  . Wears dentures    partial upper   Family History  Problem Relation Age of Onset  . Heart disease Mother   . Cancer Father    Past Surgical History:  Procedure Laterality Date  . ABDOMINAL HYSTERECTOMY    . COLONOSCOPY     Patient Active Problem List   Diagnosis Date Noted  . HLD (hyperlipidemia) 12/29/2013  . Artery disease, cerebral 11/26/2012  . Diabetes mellitus, type 2 ([place]) 11/26/2012  . Benign hypertension 03/19/2010      Prior to Admission medications   Medication Sig Start Date End Date Taking? Authorizing Provider  acetaminophen (TYLENOL) 325 MG tablet Take by mouth. 01/19/19   [provider]  amLODipine (NORVASC) 10 MG tablet Take by mouth. 01/20/19   [provider]  apixaban (ELIQUIS) 5 MG TABS tablet Take by mouth. 01/20/19 11/01/19  [provider]  aspirin EC 81 MG tablet Take by mouth. 06/05/17   [provider]  B-D ULTRA-FINE 33 LANCETS MISC Test qd Use as directed.E 11.9 01/20/19   [provider]  benzonatate (TESSALON) 100 MG capsule TAKE 1 CAPSULE BY MOUTH EVERY 6 HOURS AS NEEDED FOR COUGH 09/11/18   [provider]  Blood Glucose  Monitoring Suppl (FIFTY50 GLUCOSE METER 2.0) w/Device KIT Check BG once daily as needed Dx E11.9 01/20/19   [provider]  Blood Glucose Monitoring Suppl (ONETOUCH VERIO) w/Device KIT  01/21/19   [provider]  carvedilol (COREG) 25 MG tablet Take 25 mg by mouth 2 (two) times daily. 06/17/18   [provider]  chlorthalidone (HYGROTON) 25 MG tablet Take by mouth. 06/05/17   [provider]  glipiZIDE (GLUCOTROL XL) 5 MG 24 hr tablet TAKE 1 TABLET BY MOUTH TWICE DAILY 06/17/18   [provider]  glucose blood (PRECISION QID TEST) test strip Check BG twicec daily as needed Dx E11.9 01/20/19   [provider]  insulin aspart (NOVOLOG) 100 UNIT/ML injection Inject into the skin 3 (three) times daily before meals.    [provider]  Insulin Degludec (TRESIBA FLEXTOUCH) 200 UNIT/ML SOPN [The details of the medication are not available because there are pending changes by a home health clinician.] 01/20/19   [provider]  Insulin Glargine (LANTUS SOLOSTAR) 100 UNIT/ML Solostar Pen Inject into the skin. 01/29/17   [provider]  Insulin Pen Needle (FIFTY50 PEN NEEDLES) 31G X 8 MM MISC by Does not apply route. 01/20/19   [provider]  lisinopril (ZESTRIL) 20 MG tablet Take by mouth. 01/20/19   [provider]  metFORMIN (GLUCOPHAGE) 1000 MG tablet Take by mouth. 01/20/19   [provider]  metoprolol succinate (TOPROL-XL) 100 MG 24 hr tablet Take one tablet twice daily 12/29/13   [provider]  Omega-3 1000 MG CAPS Take by mouth.    [provider]  pravastatin (PRAVACHOL) 40 MG tablet Take by mouth. 06/05/18 11/01/19  [provider]  spironolactone (ALDACTONE) 25 MG tablet Take by mouth. 06/05/18 11/01/19  [provider]    Allergies Atorvastatin and Penicillins    Social History Social History   Tobacco Use  . Smoking status: Never Smoker  . Smokeless  tobacco: Never Used  Substance Use Topics  . Alcohol use: Not Currently    Alcohol/week: 0.0 standard drinks  . Drug use: Never    Review of Systems Patient denies headaches, rhinorrhea, blurry vision, numbness, shortness of breath, chest pain, edema, cough, abdominal pain, nausea, vomiting, diarrhea, dysuria, fevers, rashes or hallucinations unless otherwise stated above in HPI. ____________________________________________   PHYSICAL EXAM:  VITAL SIGNS: Vitals:   11/01/19 1647 11/01/19 1651  BP: (!) 205/96   Pulse:  66  Resp:    Temp:    SpO2:  97%    Constitutional: Alert and oriented.  Eyes: Conjunctivae are normal.  Head: Atraumatic. Nose: No congestion/rhinnorhea. Mouth/Throat: Mucous membranes are moist.   Neck: No stridor. Painless ROM.  Cardiovascular: Normal rate, regular rhythm. Grossly normal heart sounds.  Good peripheral circulation. Respiratory: Normal respiratory effort.  No retractions. Lungs CTAB. Gastrointestinal: Soft and nontender. No distention. No abdominal bruits. No CVA tenderness. Genitourinary:  Musculoskeletal: No lower extremity tenderness nor edema.  No joint effusions. Neurologic: CN- intact.  No facial droop, Normal FNF.  Normal heel to shin.  Sensation intact bilaterally. Normal speech and language. No gross focal neurologic deficits are appreciated. No gait instability. Skin:  Skin is warm, dry and intact. No rash noted. Psychiatric: Mood and affect are normal. Speech and behavior are normal.  ____________________________________________   LABS (all labs ordered are listed, but only abnormal results are displayed)  Results for orders placed or performed during the hospital encounter of 11/01/19 (from the past 24 hour(s))  Basic metabolic panel     Status: Abnormal   Collection Time: 11/01/19 12:44 PM  Result Value Ref Range   Sodium 138 135 - 145 mmol/L   Potassium 4.4 3.5 - 5.1 mmol/L   Chloride 106 98 - 111 mmol/L   CO2 24 22 - 32  mmol/L   Glucose, Bld 77 70 - 99 mg/dL   BUN 38 (H) 8 - 23 mg/dL   Creatinine, Ser 1.70 (H) 0.44 - 1.00 mg/dL   Calcium 9.1 8.9 - 10.3 mg/dL   GFR calc non Af Amer 31 (L) >60 mL/min   GFR calc Af Amer 36 (L) >60 mL/min   Anion gap 8 5 - 15  CBC     Status: Abnormal   Collection Time: 11/01/19 12:44 PM  Result Value Ref Range   WBC 11.5 (H) 4.0 - 10.5 K/uL   RBC 4.58 3.87 - 5.11 MIL/uL   Hemoglobin 11.5 (L) 12.0 - 15.0 g/dL   HCT 38.8 36.0 - 46.0 %   MCV 84.7 80.0 - 100.0 fL   MCH 25.1 (L) 26.0 - 34.0 pg   MCHC 29.6 (L) 30.0 - 36.0 g/dL   RDW 15.8 (H) 11.5 - 15.5 %   Platelets 307 150 - 400 K/uL   nRBC 0.0 0.0 - 0.2 %  Troponin I (High Sensitivity)     Status: Abnormal   Collection Time: 11/01/19 12:44 PM  Result Value Ref Range   Troponin I (High Sensitivity) 27 (H) <18 ng/L  Troponin I (High Sensitivity)     Status: Abnormal   Collection Time: 11/01/19  2:52 PM  Result Value Ref Range   Troponin I (High Sensitivity) 24 (H) <18 ng/L   ____________________________________________  EKG My review and personal interpretation at Time: 12:36   Indication: htn  Rate: 75  Rhythm: sinus Axis: normal Other: nonspecific st abn, no stemi,   Non specific t wave changes from uregnet care but similar morphology compared with ekg jun 2008 ____________________________________________  RADIOLOGY  I personally reviewed all radiographic images ordered to evaluate for the above acute complaints and reviewed radiology reports and findings.  These findings were personally discussed with the patient.  Please see medical record for radiology report.  ____________________________________________   PROCEDURES  Procedure(s) performed:  Procedures    Critical Care performed: no ____________________________________________   INITIAL IMPRESSION / ASSESSMENT AND PLAN / ED COURSE  Pertinent labs & imaging results that were available during my care of the patient were reviewed by me and  considered in my medical decision making (see chart for details).   DDX: htnive urgency, malignant htn, chf, acs, electrolyte abn  Dorothy Walton is a 69 y.o. who presents to the ED with hesitation for asymptomatic hypertension.  She denies any chest pain or shortness of breath.  No numbness or tingling.  No new blurry vision.  No headaches.  Does have a history of known hypertension.  States that she been compliant with her medications.  EKG is nonischemic.  Troponin with nonischemic profile.  Blood work is otherwise reassuring.  No neuro deficits.  Will give or Norvasc and reassess.  Clinical Course as of Nov 01 1735  Mon Nov 01, 2019  1730 Repeat blood pressure significantly improved.  Remains asymptomatic well-appearing in no acute distress.  This point he wishes stable appropriate for outpatient follow-up.  Does not seem consistent with ACS or acute congestive heart failure.  No signs or symptoms of CVA.  She is appropriate for discharge at this time.  Have discussed with the patient and available family all diagnostics and treatments performed thus far and all questions were answered to the best of my ability. The patient demonstrates understanding and agreement with plan.    [PR]    Clinical Course User Index [PR] Merlyn Lot, MD    The patient was evaluated in Emergency Department today for the symptoms described in the history of present illness. He/she was evaluated in the context of the global COVID-19 pandemic, which necessitated consideration that the patient might be at risk for infection with the SARS-CoV-2 virus that causes COVID-19. Institutional protocols and algorithms that pertain to the evaluation of patients at risk for COVID-19 are in a state of rapid change based on information released by regulatory bodies including the CDC and federal and state organizations. These policies and algorithms were followed during the patient's care in the ED.  As part of my medical decision  making, I reviewed the following data within the [place] notes reviewed and incorporated, Labs reviewed, notes from prior ED visits and [place] Controlled Substance Database   ____________________________________________   FINAL CLINICAL IMPRESSION(S) / ED DIAGNOSES  Final diagnoses:  Secondary hypertension      NEW MEDICATIONS STARTED DURING THIS VISIT:  New Prescriptions   No medications on file     Note:  This document was prepared using Dragon voice recognition software and may  include unintentional dictation errors.    Merlyn Lot, MD 11/01/19 1737

## 2019-11-01 NOTE — ED Triage Notes (Signed)
Patient states that she was at the eye doctor this morning and they checked her BP and was 190/113. States that they told her to come here and be evaluated. States that this upset her and now she is worried.

## 2019-11-01 NOTE — ED Notes (Signed)
Called pt without answer.

## 2019-11-15 ENCOUNTER — Ambulatory Visit: Admit: 2019-11-15 | Payer: Medicare HMO | Admitting: Ophthalmology

## 2019-11-15 SURGERY — PHACOEMULSIFICATION, CATARACT, WITH IOL INSERTION
Anesthesia: Topical | Laterality: Left

## 2019-11-29 ENCOUNTER — Encounter: Payer: Self-pay | Admitting: Podiatry

## 2019-11-29 ENCOUNTER — Ambulatory Visit: Payer: Medicare HMO | Admitting: Podiatry

## 2019-11-29 ENCOUNTER — Other Ambulatory Visit: Payer: Self-pay

## 2019-11-29 DIAGNOSIS — B351 Tinea unguium: Secondary | ICD-10-CM | POA: Diagnosis not present

## 2019-11-29 DIAGNOSIS — E119 Type 2 diabetes mellitus without complications: Secondary | ICD-10-CM

## 2019-11-29 DIAGNOSIS — M79676 Pain in unspecified toe(s): Secondary | ICD-10-CM | POA: Diagnosis not present

## 2019-12-28 ENCOUNTER — Other Ambulatory Visit: Payer: Self-pay

## 2019-12-28 ENCOUNTER — Encounter: Payer: Self-pay | Admitting: Ophthalmology

## 2019-12-30 ENCOUNTER — Other Ambulatory Visit
Admission: RE | Admit: 2019-12-30 | Discharge: 2019-12-30 | Disposition: A | Discharge status: Home / Self Care | Payer: Medicare HMO | Source: Ambulatory Visit | Attending: Ophthalmology | Admitting: Ophthalmology

## 2019-12-30 ENCOUNTER — Other Ambulatory Visit: Payer: Self-pay

## 2019-12-30 DIAGNOSIS — Z01812 Encounter for preprocedural laboratory examination: Secondary | ICD-10-CM | POA: Diagnosis present

## 2019-12-30 DIAGNOSIS — Z20822 Contact with and (suspected) exposure to covid-19: Secondary | ICD-10-CM | POA: Insufficient documentation

## 2019-12-31 LAB — SARS CORONAVIRUS 2 (TAT 6-24 HRS): SARS Coronavirus 2: NEGATIVE

## 2019-12-31 NOTE — Discharge Instructions (Signed)

## 2020-01-03 ENCOUNTER — Encounter
Admission: RE | Disposition: A | Discharge status: Home / Self Care | Payer: Self-pay | Source: Home / Self Care | Attending: Ophthalmology

## 2020-01-03 ENCOUNTER — Ambulatory Visit
Admission: RE | Admit: 2020-01-03 | Discharge: 2020-01-03 | Disposition: A | Discharge status: Home / Self Care | Payer: Medicare HMO | Attending: Ophthalmology | Admitting: Ophthalmology

## 2020-01-03 ENCOUNTER — Ambulatory Visit: Payer: Medicare HMO | Admitting: Anesthesiology

## 2020-01-03 ENCOUNTER — Encounter: Payer: Self-pay | Admitting: Ophthalmology

## 2020-01-03 ENCOUNTER — Other Ambulatory Visit: Payer: Self-pay

## 2020-01-03 DIAGNOSIS — Z79899 Other long term (current) drug therapy: Secondary | ICD-10-CM | POA: Diagnosis not present

## 2020-01-03 DIAGNOSIS — E669 Obesity, unspecified: Secondary | ICD-10-CM | POA: Diagnosis not present

## 2020-01-03 DIAGNOSIS — E1136 Type 2 diabetes mellitus with diabetic cataract: Secondary | ICD-10-CM | POA: Diagnosis present

## 2020-01-03 DIAGNOSIS — H2512 Age-related nuclear cataract, left eye: Secondary | ICD-10-CM | POA: Diagnosis not present

## 2020-01-03 DIAGNOSIS — E7800 Pure hypercholesterolemia, unspecified: Secondary | ICD-10-CM | POA: Diagnosis not present

## 2020-01-03 DIAGNOSIS — I10 Essential (primary) hypertension: Secondary | ICD-10-CM | POA: Diagnosis not present

## 2020-01-03 DIAGNOSIS — Z8616 Personal history of COVID-19: Secondary | ICD-10-CM | POA: Insufficient documentation

## 2020-01-03 DIAGNOSIS — Z6832 Body mass index (BMI) 32.0-32.9, adult: Secondary | ICD-10-CM | POA: Insufficient documentation

## 2020-01-03 DIAGNOSIS — Z794 Long term (current) use of insulin: Secondary | ICD-10-CM | POA: Insufficient documentation

## 2020-01-03 HISTORY — PX: CATARACT EXTRACTION W/PHACO: SHX586

## 2020-01-03 LAB — GLUCOSE, CAPILLARY
Glucose-Capillary: 156 mg/dL — ABNORMAL HIGH (ref 70–99)
Glucose-Capillary: 134 mg/dL — ABNORMAL HIGH (ref 70–99)

## 2020-01-03 SURGERY — PHACOEMULSIFICATION, CATARACT, WITH IOL INSERTION
Anesthesia: General | Site: Eye | Laterality: Left

## 2020-01-03 MED ORDER — TETRACAINE HCL 0.5 % OP SOLN
1.0000 [drp] | OPHTHALMIC | Status: DC | PRN
  Administered 2020-01-03 (×3): 1 [drp] via OPHTHALMIC

## 2020-01-03 MED ORDER — SODIUM HYALURONATE 10 MG/ML IO SOLN
INTRAOCULAR | Status: DC | PRN
  Administered 2020-01-03: 0.55 mL via INTRAOCULAR

## 2020-01-03 MED ORDER — EPINEPHRINE PF 1 MG/ML IJ SOLN
INTRAOCULAR | Status: DC | PRN
  Administered 2020-01-03: 44 mL via OPHTHALMIC

## 2020-01-03 MED ORDER — ONDANSETRON HCL 4 MG/2ML IJ SOLN: 4.0000 mg | Freq: Once | INTRAMUSCULAR | Status: DC | PRN

## 2020-01-03 MED ORDER — MOXIFLOXACIN HCL 0.5 % OP SOLN
OPHTHALMIC | Status: DC | PRN
  Administered 2020-01-03: 0.2 mL via OPHTHALMIC

## 2020-01-03 MED ORDER — ACETAMINOPHEN 160 MG/5ML PO SOLN: 325.0000 mg | ORAL | Status: DC | PRN

## 2020-01-03 MED ORDER — SODIUM HYALURONATE 23 MG/ML IO SOLN
INTRAOCULAR | Status: DC | PRN
  Administered 2020-01-03: 0.6 mL via INTRAOCULAR

## 2020-01-03 MED ORDER — ARMC OPHTHALMIC DILATING DROPS
1.0000 "application " | OPHTHALMIC | Status: DC | PRN
  Administered 2020-01-03 (×3): 1 via OPHTHALMIC

## 2020-01-03 MED ORDER — MIDAZOLAM HCL 2 MG/2ML IJ SOLN
INTRAMUSCULAR | Status: DC | PRN
  Administered 2020-01-03 (×2): 1 mg via INTRAVENOUS

## 2020-01-03 MED ORDER — LIDOCAINE HCL (PF) 2 % IJ SOLN
INTRAOCULAR | Status: DC | PRN
  Administered 2020-01-03: 1 mL via INTRAOCULAR

## 2020-01-03 MED ORDER — ACETAMINOPHEN 325 MG PO TABS: 325.0000 mg | ORAL_TABLET | ORAL | Status: DC | PRN

## 2020-01-03 MED ORDER — FENTANYL CITRATE (PF) 100 MCG/2ML IJ SOLN
INTRAMUSCULAR | Status: DC | PRN
  Administered 2020-01-03: 50 ug via INTRAVENOUS

## 2020-01-03 SURGICAL SUPPLY — 20 items
CANNULA ANT/CHMB 27G (MISCELLANEOUS) ×2 IMPLANT
CANNULA ANT/CHMB 27GA (MISCELLANEOUS) ×6 IMPLANT
DISSECTOR HYDRO NUCLEUS 50X22 (MISCELLANEOUS) ×3 IMPLANT
GLOVE SURG LX 7.5 STRW (GLOVE) ×2
GLOVE SURG LX STRL 7.5 STRW (GLOVE) ×1 IMPLANT
GLOVE SURG SYN 8.5  E (GLOVE) ×2
GLOVE SURG SYN 8.5 E (GLOVE) ×1 IMPLANT
GLOVE SURG SYN 8.5 PF PI (GLOVE) ×1 IMPLANT
GOWN STRL REUS W/ TWL LRG LVL3 (GOWN DISPOSABLE) ×2 IMPLANT
GOWN STRL REUS W/TWL LRG LVL3 (GOWN DISPOSABLE) ×4
LENS IOL DIOP 20.5 (Intraocular Lens) ×3 IMPLANT
LENS IOL TECNIS MONO 20.5 (Intraocular Lens) IMPLANT
MARKER SKIN DUAL TIP RULER LAB (MISCELLANEOUS) ×3 IMPLANT
PACK DR. KING ARMS (PACKS) ×3 IMPLANT
PACK EYE AFTER SURG (MISCELLANEOUS) ×3 IMPLANT
PACK OPTHALMIC (MISCELLANEOUS) ×3 IMPLANT
SYR 3ML LL SCALE MARK (SYRINGE) ×3 IMPLANT
SYR TB 1ML LUER SLIP (SYRINGE) ×3 IMPLANT
WATER STERILE IRR 250ML POUR (IV SOLUTION) ×3 IMPLANT
WIPE NON LINTING 3.25X3.25 (MISCELLANEOUS) ×3 IMPLANT

## 2020-01-03 NOTE — Anesthesia Preprocedure Evaluation (Signed)
Anesthesia Evaluation  Patient identified by MRN, date of birth, ID band Patient awake    Reviewed: Allergy & Precautions, NPO status   Airway Mallampati: II  TM Distance: >3 FB     Dental   Pulmonary    breath sounds clear to auscultation       Cardiovascular hypertension,  Rhythm:Regular Rate:Normal  Hx PE 12/2018  HLD   Neuro/Psych CVA (> 20 years ago, no deficits)    GI/Hepatic   Endo/Other  diabetes, Type 2Obesity - BMI 32  Renal/GU      Musculoskeletal   Abdominal   Peds  Hematology   Anesthesia Other Findings   Reproductive/Obstetrics                             Anesthesia Physical Anesthesia Plan  ASA: III  Anesthesia Plan: General   Post-op Pain Management:    Induction: Intravenous  PONV Risk Score and Plan: Propofol infusion, TIVA and Treatment may vary due to age or medical condition  Airway Management Planned: Natural Airway and Nasal Cannula  Additional Equipment:   Intra-op Plan:   Post-operative Plan:   Informed Consent: I have reviewed the patients History and Physical, chart, labs and discussed the procedure including the risks, benefits and alternatives for the proposed anesthesia with the patient or authorized representative who has indicated his/her understanding and acceptance.       Plan Discussed with: CRNA  Anesthesia Plan Comments:         Anesthesia Quick Evaluation

## 2020-01-03 NOTE — Op Note (Signed)
OPERATIVE NOTE  Dorothy Walton [id number] 01/03/2020   PREOPERATIVE DIAGNOSIS:  Nuclear sclerotic cataract left eye.  H25.12   POSTOPERATIVE DIAGNOSIS:    Nuclear sclerotic cataract left eye.     PROCEDURE:  Phacoemusification with posterior chamber intraocular lens placement of the left eye   LENS:   Implant Name Type Inv. Item Serial No. Manufacturer Lot No. LRB No. Used Action  LENS IOL DIOP 20.5 - [id number] Intraocular Lens LENS IOL DIOP 20.5 [id number] AMO  Left 1 Implanted      Procedure(s) with comments: CATARACT EXTRACTION PHACO AND INTRAOCULAR LENS PLACEMENT (IOC) LEFT DIABETIC 1.82  00:20.7 (Left) - Diabetic - insulin and oral meds  DCB00 +20.5   ULTRASOUND TIME: 0 minutes 20 seconds.  CDE 1.82   SURGEON:  Benay Pillow, MD, MPH   ANESTHESIA:  Topical with tetracaine drops augmented with 1% preservative-free intracameral lidocaine.  ESTIMATED BLOOD LOSS: <1 mL   COMPLICATIONS:  None.   DESCRIPTION OF PROCEDURE:  The patient was identified in the holding room and transported to the operating room and placed in the supine position under the operating microscope.  The left eye was identified as the operative eye and it was prepped and draped in the usual sterile ophthalmic fashion.   A 1.0 millimeter clear-corneal paracentesis was made at the 5:00 position. 0.5 ml of preservative-free 1% lidocaine with epinephrine was injected into the anterior chamber.  The anterior chamber was filled with Healon 5 viscoelastic.  A 2.4 millimeter keratome was used to make a near-clear corneal incision at the 2:00 position.  A curvilinear capsulorrhexis was made with a cystotome and capsulorrhexis forceps.  Balanced salt solution was used to hydrodissect and hydrodelineate the nucleus.   Phacoemulsification was then used in stop and chop fashion to remove the lens nucleus and epinucleus.  The remaining cortex was then removed using the irrigation and aspiration handpiece. Healon was then placed  into the capsular bag to distend it for lens placement.  A lens was then injected into the capsular bag.  The remaining viscoelastic was aspirated.   Wounds were hydrated with balanced salt solution.  The anterior chamber was inflated to a physiologic pressure with balanced salt solution.  Intracameral vigamox 0.1 mL undiltued was injected into the eye and a drop placed onto the ocular surface.  No wound leaks were noted.  The patient was taken to the recovery room in stable condition without complications of anesthesia or surgery  Benay Pillow 01/03/2020, 10:34 AM

## 2020-01-03 NOTE — Transfer of Care (Signed)
Immediate Anesthesia Transfer of Care Note  Patient: Dorothy Walton  Procedure(s) Performed: CATARACT EXTRACTION PHACO AND INTRAOCULAR LENS PLACEMENT (IOC) LEFT DIABETIC 1.82  00:20.7 (Left Eye)  Patient Location: PACU  Anesthesia Type: General  Level of Consciousness: awake, alert  and patient cooperative  Airway and Oxygen Therapy: Patient Spontanous Breathing and Patient connected to supplemental oxygen  Post-op Assessment: Post-op Vital signs reviewed, Patient's Cardiovascular Status Stable, Respiratory Function Stable, Patent Airway and No signs of Nausea or vomiting  Post-op Vital Signs: Reviewed and stable  Complications: No complications documented.

## 2020-01-03 NOTE — H&P (Signed)

## 2020-01-03 NOTE — Anesthesia Postprocedure Evaluation (Signed)
Anesthesia Post Note  Patient: Dorothy Walton  Procedure(s) Performed: CATARACT EXTRACTION PHACO AND INTRAOCULAR LENS PLACEMENT (IOC) LEFT DIABETIC 1.82  00:20.7 (Left Eye)     Patient location during evaluation: PACU Anesthesia Type: General Level of consciousness: awake Pain management: pain level controlled Vital Signs Assessment: post-procedure vital signs reviewed and stable Respiratory status: respiratory function stable Cardiovascular status: stable Postop Assessment: no apparent nausea or vomiting Anesthetic complications: no   No complications documented.  Veda Canning

## 2020-01-03 NOTE — Anesthesia Procedure Notes (Signed)
Procedure Name: MAC Date/Time: 01/03/2020 10:16 AM Performed by: Cameron Ali, CRNA Pre-anesthesia Checklist: Patient identified, Emergency Drugs available, Suction available, Timeout performed and Patient being monitored Patient Re-evaluated:Patient Re-evaluated prior to induction Oxygen Delivery Method: Nasal cannula Placement Confirmation: positive ETCO2

## 2020-02-02 ENCOUNTER — Encounter: Payer: Self-pay | Admitting: Ophthalmology

## 2020-02-02 ENCOUNTER — Other Ambulatory Visit: Payer: Self-pay

## 2020-02-07 ENCOUNTER — Ambulatory Visit: Admission: RE | Admit: 2020-02-07 | Payer: Medicare HMO | Source: Home / Self Care | Admitting: Ophthalmology

## 2020-02-07 SURGERY — PHACOEMULSIFICATION, CATARACT, WITH IOL INSERTION
Anesthesia: Topical | Laterality: Right

## 2020-03-02 ENCOUNTER — Other Ambulatory Visit: Payer: Self-pay

## 2020-03-02 ENCOUNTER — Encounter: Payer: Self-pay | Admitting: Podiatry

## 2020-03-02 ENCOUNTER — Ambulatory Visit (INDEPENDENT_AMBULATORY_CARE_PROVIDER_SITE_OTHER): Payer: Medicare HMO | Admitting: Podiatry

## 2020-03-02 DIAGNOSIS — E119 Type 2 diabetes mellitus without complications: Secondary | ICD-10-CM

## 2020-03-02 DIAGNOSIS — B351 Tinea unguium: Secondary | ICD-10-CM

## 2020-03-02 DIAGNOSIS — M79676 Pain in unspecified toe(s): Secondary | ICD-10-CM

## 2020-03-02 NOTE — Progress Notes (Signed)
Complaint:  Visit Type: Patient returns to my office for continued preventative foot care services. Complaint: Patient states" my nails have grown long and thick and become painful to walk and wear shoes" Patient has been diagnosed with DM with neuropathy. The patient presents for preventative foot care services. No changes to ROS.  Patient was hospitalized with Covid.  Podiatric Exam: Vascular: dorsalis pedis and posterior tibial pulses are palpable bilateral. Capillary return is immediate. Temperature gradient is WNL. Skin turgor WNL  Sensorium: Diminished  Semmes Weinstein monofilament test. Normal tactile sensation bilaterally. Nail Exam: Pt has thick disfigured discolored nails with subungual debris especially fifth toenail left foot. Ulcer Exam: There is no evidence of ulcer or pre-ulcerative changes or infection. Orthopedic Exam: Muscle tone and strength are WNL. No limitations in general ROM. No crepitus or effusions noted. Adducto-varus fifth toes  B/L Bony prominences are unremarkable. Skin: No Porokeratosis. No infection or ulcers  Diagnosis:  Onychomycosis   Treatment & Plan Procedures and Treatment: Consent by patient was obtained for treatment procedures. The patient understood the discussion of treatment and procedures well. All questions were answered thoroughly reviewed. Debridement of mycotic and hypertrophic toenails, 1 through 5 bilateral and clearing of subungual debris. No ulceration, no infection noted.  Return Visit-Office Procedure: Patient instructed to return to the office for a follow up visit 3 months for continued evaluation and treatment.    Gardiner Barefoot DPM

## 2020-03-16 ENCOUNTER — Other Ambulatory Visit: Payer: Self-pay

## 2020-03-16 ENCOUNTER — Other Ambulatory Visit
Admission: RE | Admit: 2020-03-16 | Discharge: 2020-03-16 | Disposition: A | Discharge status: Home / Self Care | Payer: Medicare HMO | Source: Ambulatory Visit | Attending: Ophthalmology | Admitting: Ophthalmology

## 2020-03-16 DIAGNOSIS — Z20822 Contact with and (suspected) exposure to covid-19: Secondary | ICD-10-CM | POA: Diagnosis not present

## 2020-03-16 DIAGNOSIS — Z01812 Encounter for preprocedural laboratory examination: Secondary | ICD-10-CM | POA: Insufficient documentation

## 2020-03-16 DIAGNOSIS — N189 Chronic kidney disease, unspecified: Secondary | ICD-10-CM | POA: Insufficient documentation

## 2020-03-16 DIAGNOSIS — N1832 Chronic kidney disease, stage 3b: Secondary | ICD-10-CM | POA: Insufficient documentation

## 2020-03-16 DIAGNOSIS — D631 Anemia in chronic kidney disease: Secondary | ICD-10-CM | POA: Insufficient documentation

## 2020-03-16 DIAGNOSIS — R809 Proteinuria, unspecified: Secondary | ICD-10-CM | POA: Insufficient documentation

## 2020-03-16 DIAGNOSIS — I129 Hypertensive chronic kidney disease with stage 1 through stage 4 chronic kidney disease, or unspecified chronic kidney disease: Secondary | ICD-10-CM | POA: Insufficient documentation

## 2020-03-16 LAB — SARS CORONAVIRUS 2 (TAT 6-24 HRS): SARS Coronavirus 2: NEGATIVE

## 2020-03-16 NOTE — Discharge Instructions (Signed)

## 2020-03-17 ENCOUNTER — Other Ambulatory Visit: Payer: Self-pay | Admitting: Nephrology

## 2020-03-17 DIAGNOSIS — R809 Proteinuria, unspecified: Secondary | ICD-10-CM

## 2020-03-17 DIAGNOSIS — N181 Chronic kidney disease, stage 1: Secondary | ICD-10-CM

## 2020-03-17 DIAGNOSIS — N1832 Chronic kidney disease, stage 3b: Secondary | ICD-10-CM

## 2020-03-20 ENCOUNTER — Ambulatory Visit
Admission: RE | Admit: 2020-03-20 | Discharge: 2020-03-20 | Disposition: A | Discharge status: Home / Self Care | Payer: Medicare HMO | Attending: Ophthalmology | Admitting: Ophthalmology

## 2020-03-20 ENCOUNTER — Other Ambulatory Visit: Payer: Self-pay

## 2020-03-20 ENCOUNTER — Ambulatory Visit: Payer: Medicare HMO | Admitting: Anesthesiology

## 2020-03-20 ENCOUNTER — Encounter
Admission: RE | Disposition: A | Discharge status: Home / Self Care | Payer: Self-pay | Source: Home / Self Care | Attending: Ophthalmology

## 2020-03-20 DIAGNOSIS — Z539 Procedure and treatment not carried out, unspecified reason: Secondary | ICD-10-CM | POA: Diagnosis not present

## 2020-03-20 DIAGNOSIS — H2511 Age-related nuclear cataract, right eye: Secondary | ICD-10-CM | POA: Diagnosis not present

## 2020-03-20 LAB — GLUCOSE, CAPILLARY: Glucose-Capillary: 249 mg/dL — ABNORMAL HIGH (ref 70–99)

## 2020-03-20 SURGERY — PHACOEMULSIFICATION, CATARACT, WITH IOL INSERTION
Anesthesia: General | Laterality: Right

## 2020-03-20 MED ORDER — TETRACAINE HCL 0.5 % OP SOLN: 1.0000 [drp] | OPHTHALMIC | Status: DC | PRN

## 2020-03-20 MED ORDER — ARMC OPHTHALMIC DILATING DROPS: 1.0000 "application " | OPHTHALMIC | Status: DC | PRN

## 2020-03-20 SURGICAL SUPPLY — 16 items
CANNULA ANT/CHMB 27GA (MISCELLANEOUS) ×4 IMPLANT
DISSECTOR HYDRO NUCLEUS 50X22 (MISCELLANEOUS) ×2 IMPLANT
GLOVE SURG LX 7.5 STRW (GLOVE) ×1
GLOVE SURG LX STRL 7.5 STRW (GLOVE) ×1 IMPLANT
GLOVE SURG SYN 8.5  E (GLOVE) ×1
GLOVE SURG SYN 8.5 E (GLOVE) ×1 IMPLANT
GOWN STRL REUS W/ TWL LRG LVL3 (GOWN DISPOSABLE) ×2 IMPLANT
GOWN STRL REUS W/TWL LRG LVL3 (GOWN DISPOSABLE) ×2
MARKER SKIN DUAL TIP RULER LAB (MISCELLANEOUS) ×2 IMPLANT
PACK DR. KING ARMS (PACKS) ×2 IMPLANT
PACK EYE AFTER SURG (MISCELLANEOUS) ×2 IMPLANT
PACK OPTHALMIC (MISCELLANEOUS) ×2 IMPLANT
SYR 3ML LL SCALE MARK (SYRINGE) ×2 IMPLANT
SYR TB 1ML LUER SLIP (SYRINGE) ×2 IMPLANT
WATER STERILE IRR 250ML POUR (IV SOLUTION) ×2 IMPLANT
WIPE NON LINTING 3.25X3.25 (MISCELLANEOUS) ×2 IMPLANT

## 2020-03-20 NOTE — Progress Notes (Signed)
Pt's procedure cancelled d/t per elevated blood pressure and blood sugar (249). Patient educated on the importance of taking blood pressure medicine prior to procedure and managing blood glucose level. Pt. Verbalized understanding and stated that she would call back to reschedule.

## 2020-03-20 NOTE — Anesthesia Preprocedure Evaluation (Deleted)
Anesthesia Evaluation  Patient identified by MRN, date of birth, ID band Patient awake    Reviewed: Allergy & Precautions, NPO status   Airway Mallampati: II  TM Distance: >3 FB     Dental   Pulmonary    breath sounds clear to auscultation       Cardiovascular hypertension,  Rhythm:Regular Rate:Normal  Hx PE 12/2018  HLD   Neuro/Psych CVA (> 20 years ago, no deficits)    GI/Hepatic   Endo/Other  diabetes, Type 2Obesity - BMI 32  Renal/GU      Musculoskeletal   Abdominal   Peds  Hematology   Anesthesia Other Findings   Reproductive/Obstetrics                             Anesthesia Physical  Anesthesia Plan  ASA: III  Anesthesia Plan: General   Post-op Pain Management:    Induction: Intravenous  PONV Risk Score and Plan: Propofol infusion, TIVA and Treatment may vary due to age or medical condition  Airway Management Planned: Natural Airway and Nasal Cannula  Additional Equipment:   Intra-op Plan:   Post-operative Plan:   Informed Consent: I have reviewed the patients History and Physical, chart, labs and discussed the procedure including the risks, benefits and alternatives for the proposed anesthesia with the patient or authorized representative who has indicated his/her understanding and acceptance.       Plan Discussed with: CRNA  Anesthesia Plan Comments:         Anesthesia Quick Evaluation

## 2020-03-20 NOTE — Anesthesia Preprocedure Evaluation (Deleted)
Anesthesia Evaluation    Airway        Dental   Pulmonary           Cardiovascular hypertension,      Neuro/Psych    GI/Hepatic   Endo/Other  diabetes  Renal/GU      Musculoskeletal   Abdominal   Peds  Hematology   Anesthesia Other Findings   Reproductive/Obstetrics                             Anesthesia Physical Anesthesia Plan Anesthesia Quick Evaluation   Pt states she has a lot going on in at home with several ill family members and a couple of deaths.  Her BP and glucose were high.  After speaking with the patient, she would prefer to reschedule.

## 2020-06-08 ENCOUNTER — Other Ambulatory Visit: Payer: Self-pay

## 2020-06-08 ENCOUNTER — Encounter: Payer: Self-pay | Admitting: Podiatry

## 2020-06-08 ENCOUNTER — Ambulatory Visit (INDEPENDENT_AMBULATORY_CARE_PROVIDER_SITE_OTHER): Payer: Medicare HMO | Admitting: Podiatry

## 2020-06-08 DIAGNOSIS — B351 Tinea unguium: Secondary | ICD-10-CM | POA: Diagnosis not present

## 2020-06-08 DIAGNOSIS — M79676 Pain in unspecified toe(s): Secondary | ICD-10-CM | POA: Diagnosis not present

## 2020-06-08 DIAGNOSIS — E119 Type 2 diabetes mellitus without complications: Secondary | ICD-10-CM

## 2020-06-08 NOTE — Progress Notes (Signed)
Complaint:  Visit Type: Patient returns to my office for continued preventative foot care services. Complaint: Patient states" my nails have grown long and thick and become painful to walk and wear shoes" Patient has been diagnosed with DM with neuropathy. The patient presents for preventative foot care services. No changes to ROS.  Patient was hospitalized with Covid and now has left leg swelling.  Podiatric Exam: Vascular: dorsalis pedis and posterior tibial pulses are palpable bilateral. Capillary return is immediate. Temperature gradient is WNL. Skin turgor WNL  Sensorium: Diminished  Semmes Weinstein monofilament test. Normal tactile sensation bilaterally. Nail Exam: Pt has thick disfigured discolored nails with subungual debris especially fifth toenail left foot. Ulcer Exam: There is no evidence of ulcer or pre-ulcerative changes or infection. Orthopedic Exam: Muscle tone and strength are WNL. No limitations in general ROM. No crepitus or effusions noted. Adducto-varus fifth toes  B/L Bony prominences are unremarkable. Skin: No Porokeratosis. No infection or ulcers  Diagnosis:  Onychomycosis   Treatment & Plan Procedures and Treatment: Consent by patient was obtained for treatment procedures. The patient understood the discussion of treatment and procedures well. All questions were answered thoroughly reviewed. Debridement of mycotic and hypertrophic toenails, 1 through 5 bilateral and clearing of subungual debris. No ulceration, no infection noted.  Return Visit-Office Procedure: Patient instructed to return to the office for a follow up visit 3 months for continued evaluation and treatment.    Gardiner Barefoot DPM

## 2020-08-15 IMAGING — CR DG CHEST 2V
2 series · 2 of 2 positions shown · non-contrast
Comparison: Chest x-ray dated January 03, 2019.

CLINICAL DATA: Hypertension.

EXAM:
CHEST - 2 VIEW

[chest pa]
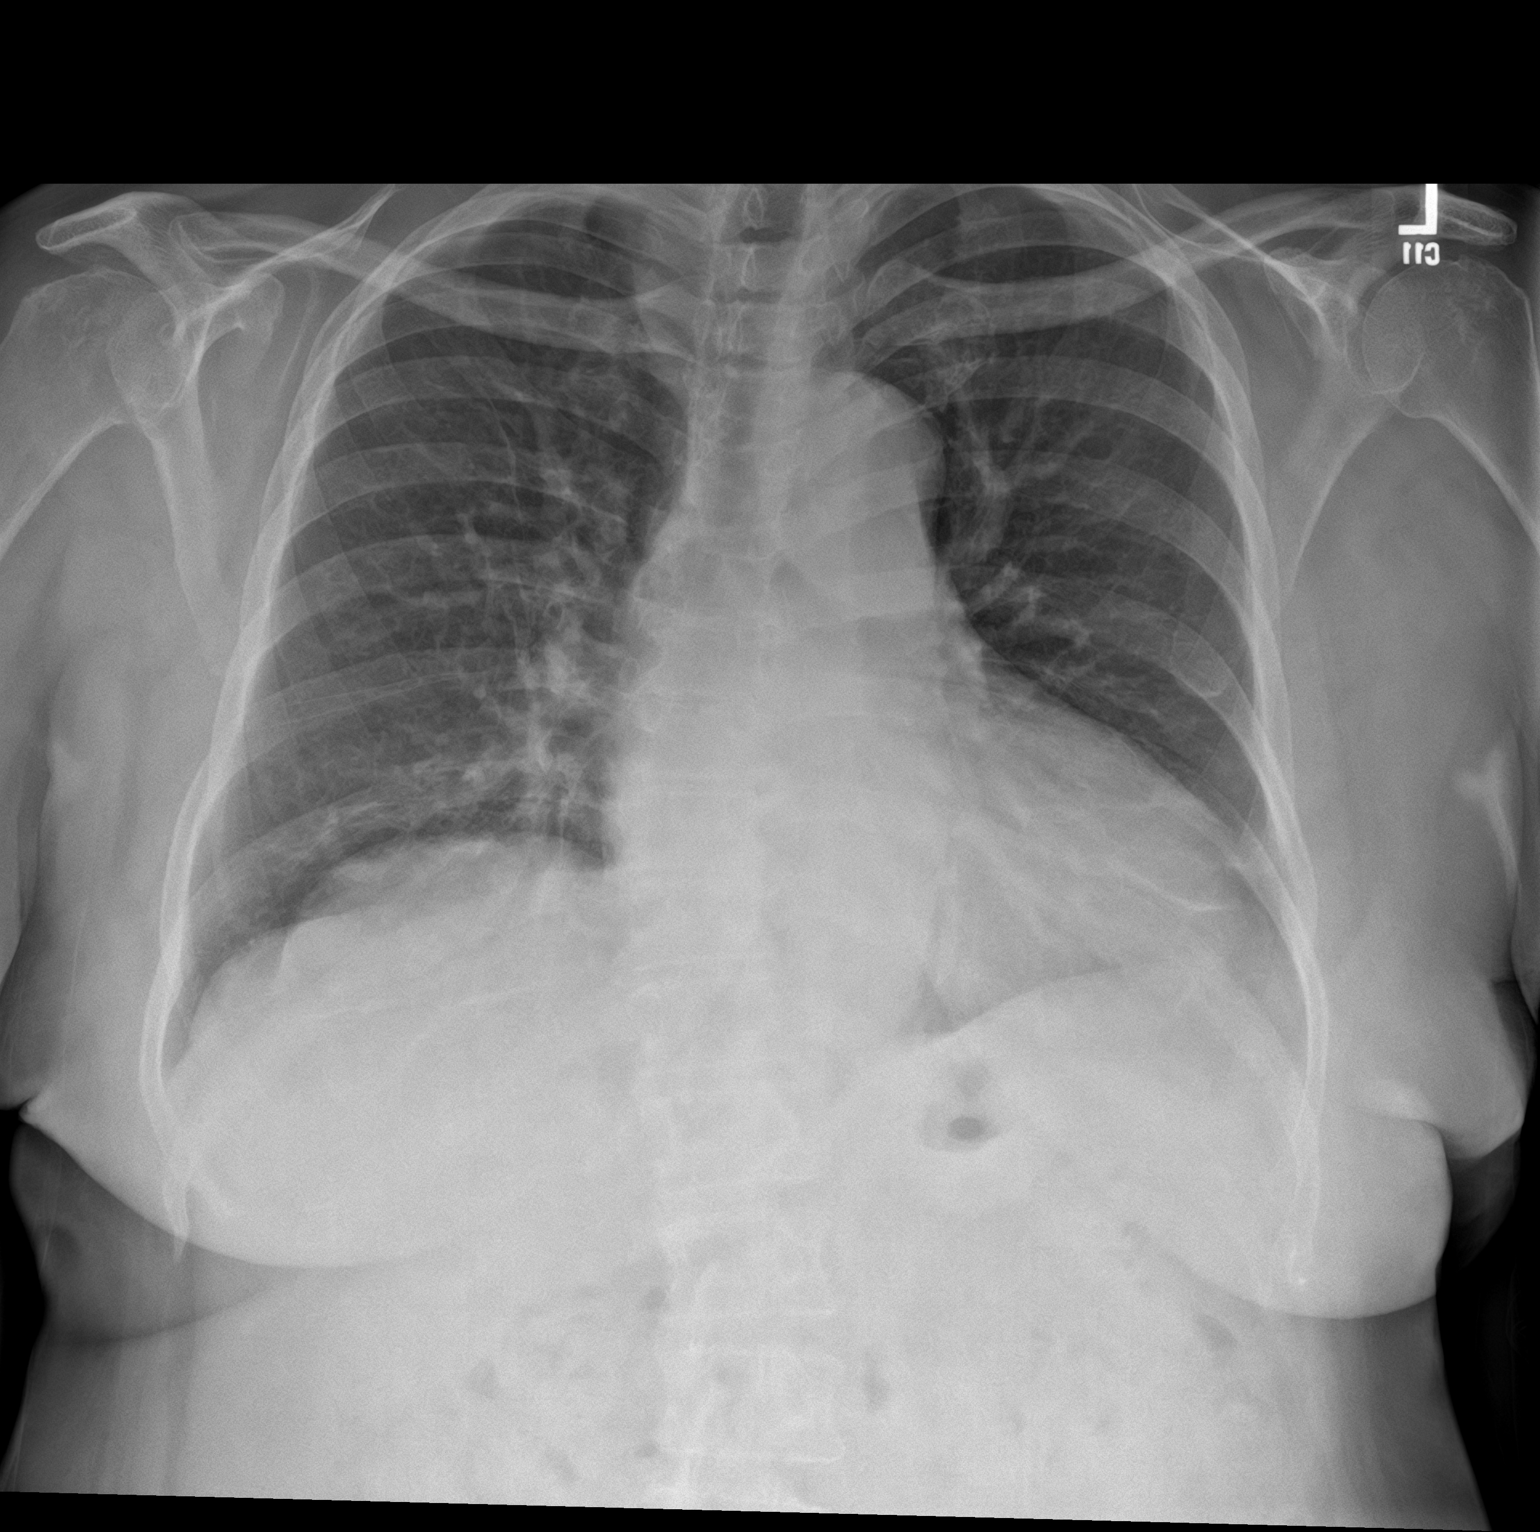

[chest lat]
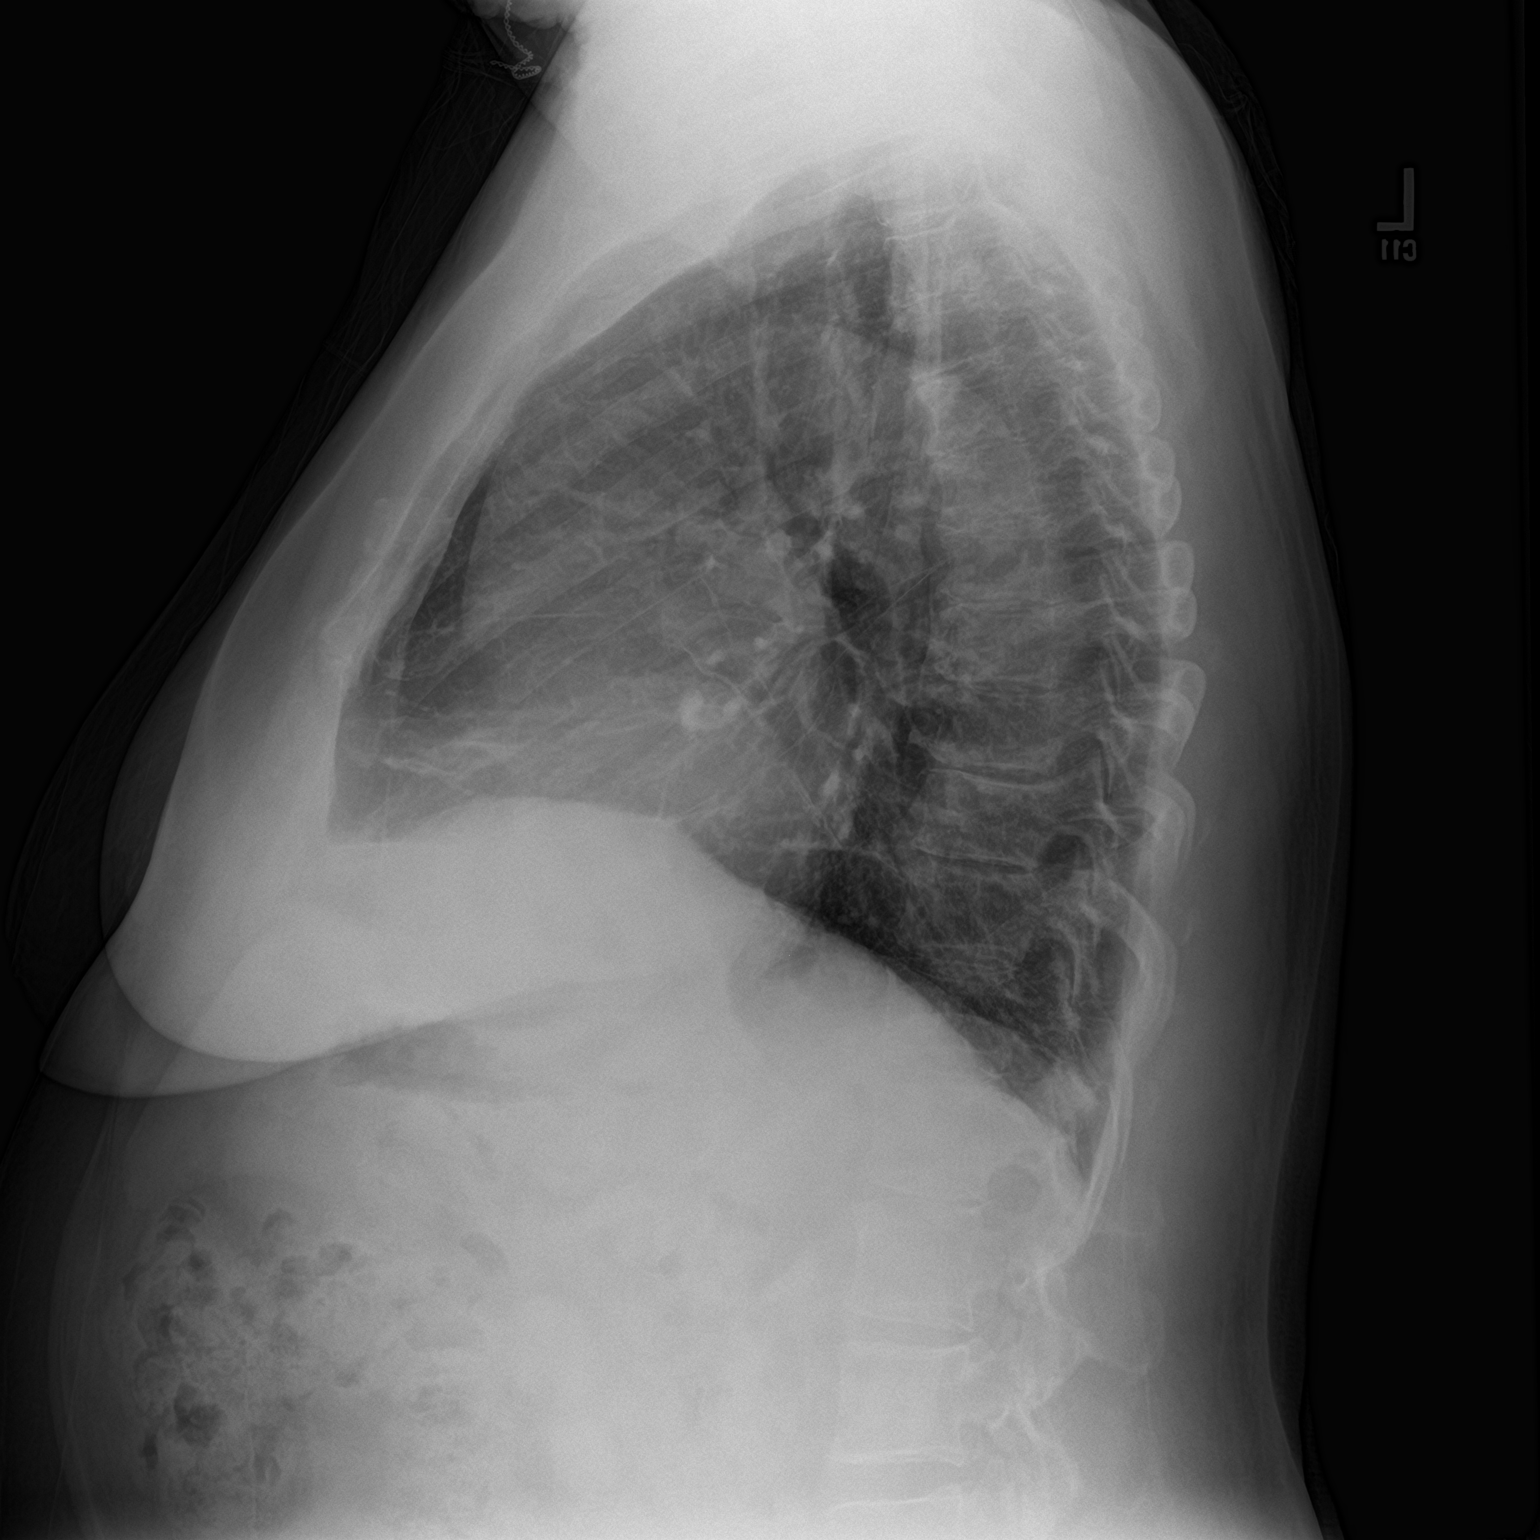

[2 of 2 positions shown; findings below may reference images not displayed]

FINDINGS: Unchanged mild cardiomegaly. Normal pulmonary vascularity. Mild
linear scarring at both lung bases. No focal consolidation, pleural
effusion, or pneumothorax. No acute osseous abnormality.
IMPRESSION: No active cardiopulmonary disease.

## 2020-09-07 ENCOUNTER — Ambulatory Visit: Payer: Medicare HMO | Admitting: Podiatry

## 2020-09-21 ENCOUNTER — Ambulatory Visit (INDEPENDENT_AMBULATORY_CARE_PROVIDER_SITE_OTHER): Payer: Medicare HMO | Admitting: Podiatry

## 2020-09-21 ENCOUNTER — Other Ambulatory Visit: Payer: Self-pay

## 2020-09-21 ENCOUNTER — Encounter: Payer: Self-pay | Admitting: Podiatry

## 2020-09-21 DIAGNOSIS — M79676 Pain in unspecified toe(s): Secondary | ICD-10-CM | POA: Diagnosis not present

## 2020-09-21 DIAGNOSIS — E119 Type 2 diabetes mellitus without complications: Secondary | ICD-10-CM | POA: Diagnosis not present

## 2020-09-21 DIAGNOSIS — B351 Tinea unguium: Secondary | ICD-10-CM

## 2020-09-21 NOTE — Progress Notes (Signed)
Complaint:  Visit Type: Patient returns to my office for continued preventative foot care services. Complaint: Patient states" my nails have grown long and thick and become painful to walk and wear shoes" Patient has been diagnosed with DM with neuropathy. The patient presents for preventative foot care services. No changes to ROS.  Patient was hospitalized with Covid and now has left leg swelling.  Podiatric Exam: Vascular: dorsalis pedis and posterior tibial pulses are palpable bilateral. Capillary return is immediate. Temperature gradient is WNL. Skin turgor WNL  Sensorium: Diminished  Semmes Weinstein monofilament test. Normal tactile sensation bilaterally. Nail Exam: Pt has thick disfigured discolored nails with subungual debris especially fifth toenail left foot. Ulcer Exam: There is no evidence of ulcer or pre-ulcerative changes or infection. Orthopedic Exam: Muscle tone and strength are WNL. No limitations in general ROM. No crepitus or effusions noted. Adducto-varus fifth toes  B/L Bony prominences are unremarkable. Skin: No Porokeratosis. No infection or ulcers  Diagnosis:  Onychomycosis   Treatment & Plan Procedures and Treatment: Consent by patient was obtained for treatment procedures. The patient understood the discussion of treatment and procedures well. All questions were answered thoroughly reviewed. Debridement of mycotic and hypertrophic toenails, 1 through 5 bilateral and clearing of subungual debris. No ulceration, no infection noted.  Return Visit-Office Procedure: Patient instructed to return to the office for a follow up visit 3 months for continued evaluation and treatment.    Gardiner Barefoot DPM

## 2020-11-21 DIAGNOSIS — N2581 Secondary hyperparathyroidism of renal origin: Secondary | ICD-10-CM | POA: Insufficient documentation

## 2020-11-21 DIAGNOSIS — N184 Chronic kidney disease, stage 4 (severe): Secondary | ICD-10-CM | POA: Insufficient documentation

## 2020-12-28 ENCOUNTER — Ambulatory Visit: Payer: Medicare HMO | Admitting: Podiatry

## 2021-02-19 ENCOUNTER — Encounter: Payer: Self-pay | Admitting: Podiatry

## 2021-02-19 ENCOUNTER — Ambulatory Visit (INDEPENDENT_AMBULATORY_CARE_PROVIDER_SITE_OTHER): Payer: Medicare HMO | Admitting: Podiatry

## 2021-02-19 ENCOUNTER — Other Ambulatory Visit: Payer: Self-pay

## 2021-02-19 DIAGNOSIS — E119 Type 2 diabetes mellitus without complications: Secondary | ICD-10-CM

## 2021-02-19 DIAGNOSIS — B351 Tinea unguium: Secondary | ICD-10-CM | POA: Diagnosis not present

## 2021-02-19 DIAGNOSIS — M79675 Pain in left toe(s): Secondary | ICD-10-CM

## 2021-02-19 DIAGNOSIS — M79676 Pain in unspecified toe(s): Secondary | ICD-10-CM

## 2021-02-19 DIAGNOSIS — M79674 Pain in right toe(s): Secondary | ICD-10-CM | POA: Diagnosis not present

## 2021-02-19 NOTE — Progress Notes (Signed)
Complaint:  Visit Type: Patient returns to my office for continued preventative foot care services. Complaint: Patient states" my nails have grown long and thick and become painful to walk and wear shoes" Patient has been diagnosed with DM with neuropathy. The patient presents for preventative foot care services. No changes to ROS.  Patient was hospitalized with Covid and now has left leg swelling.  Podiatric Exam: Vascular: dorsalis pedis and posterior tibial pulses are palpable bilateral. Capillary return is immediate. Temperature gradient is WNL. Skin turgor WNL  Sensorium: Diminished  Semmes Weinstein monofilament test. Normal tactile sensation bilaterally. Nail Exam: Pt has thick disfigured discolored nails with subungual debris especially fifth toenail left foot. Ulcer Exam: There is no evidence of ulcer or pre-ulcerative changes or infection. Orthopedic Exam: Muscle tone and strength are WNL. No limitations in general ROM. No crepitus or effusions noted. Adducto-varus fifth toes  B/L Bony prominences are unremarkable. Skin: No Porokeratosis. No infection or ulcers  Diagnosis:  Onychomycosis   Treatment & Plan Procedures and Treatment: Consent by patient was obtained for treatment procedures. The patient understood the discussion of treatment and procedures well. All questions were answered thoroughly reviewed. Debridement of mycotic and hypertrophic toenails, 1 through 5 bilateral and clearing of subungual debris. No ulceration, no infection noted.  Return Visit-Office Procedure: Patient instructed to return to the office for a follow up visit 3 months for continued evaluation and treatment.    Gardiner Barefoot DPM

## 2021-02-21 DIAGNOSIS — I161 Hypertensive emergency: Secondary | ICD-10-CM | POA: Insufficient documentation

## 2021-05-24 ENCOUNTER — Ambulatory Visit: Payer: Medicare HMO | Admitting: Podiatry

## 2021-08-30 ENCOUNTER — Ambulatory Visit: Payer: Medicare HMO | Admitting: Podiatry

## 2021-08-30 ENCOUNTER — Other Ambulatory Visit: Payer: Self-pay

## 2021-08-30 ENCOUNTER — Encounter: Payer: Self-pay | Admitting: Podiatry

## 2021-08-30 DIAGNOSIS — B351 Tinea unguium: Secondary | ICD-10-CM | POA: Diagnosis not present

## 2021-08-30 DIAGNOSIS — E119 Type 2 diabetes mellitus without complications: Secondary | ICD-10-CM | POA: Diagnosis not present

## 2021-08-30 DIAGNOSIS — M79676 Pain in unspecified toe(s): Secondary | ICD-10-CM

## 2021-08-30 NOTE — Progress Notes (Signed)
Complaint:  Visit Type: Patient returns to my office for continued preventative foot care services. Complaint: Patient states" my nails have grown long and thick and become painful to walk and wear shoes" Patient has been diagnosed with DM with neuropathy. The patient presents for preventative foot care services. No changes to ROS.  Patient was hospitalized with Covid and now has left leg swelling.  Podiatric Exam: Vascular: dorsalis pedis and posterior tibial pulses are palpable bilateral. Capillary return is immediate. Temperature gradient is WNL. Skin turgor WNL  Sensorium: Diminished  Semmes Weinstein monofilament test. Normal tactile sensation bilaterally. Nail Exam: Pt has thick disfigured discolored nails with subungual debris especially fifth toenail left foot. Ulcer Exam: There is no evidence of ulcer or pre-ulcerative changes or infection. Orthopedic Exam: Muscle tone and strength are WNL. No limitations in general ROM. No crepitus or effusions noted. Adducto-varus fifth toes  B/L Bony prominences are unremarkable. Skin: No Porokeratosis. No infection or ulcers  Diagnosis:  Onychomycosis   Treatment & Plan Procedures and Treatment: Consent by patient was obtained for treatment procedures. The patient understood the discussion of treatment and procedures well. All questions were answered thoroughly reviewed. Debridement of mycotic and hypertrophic toenails, 1 through 5 bilateral and clearing of subungual debris. No ulceration, no infection noted.  Return Visit-Office Procedure: Patient instructed to return to the office for a follow up visit 3 months for continued evaluation and treatment.    Gardiner Barefoot DPM

## 2021-11-29 ENCOUNTER — Ambulatory Visit: Payer: Medicare HMO | Admitting: Podiatry

## 2021-12-20 ENCOUNTER — Ambulatory Visit: Payer: Medicare HMO | Admitting: Podiatry

## 2021-12-20 ENCOUNTER — Encounter: Payer: Self-pay | Admitting: Podiatry

## 2021-12-20 DIAGNOSIS — B351 Tinea unguium: Secondary | ICD-10-CM | POA: Diagnosis not present

## 2021-12-20 DIAGNOSIS — M79676 Pain in unspecified toe(s): Secondary | ICD-10-CM | POA: Diagnosis not present

## 2021-12-20 DIAGNOSIS — E119 Type 2 diabetes mellitus without complications: Secondary | ICD-10-CM | POA: Diagnosis not present

## 2021-12-20 NOTE — Progress Notes (Signed)
Complaint:  Visit Type: Patient returns to my office for continued preventative foot care services. Complaint: Patient states" my nails have grown long and thick and become painful to walk and wear shoes" Patient has been diagnosed with DM with neuropathy. The patient presents for preventative foot care services. No changes to ROS.  Patient was hospitalized with Covid and now has left leg swelling.  Podiatric Exam: Vascular: dorsalis pedis and posterior tibial pulses are palpable bilateral. Capillary return is immediate. Temperature gradient is WNL. Skin turgor WNL  Sensorium: Diminished  Semmes Weinstein monofilament test. Normal tactile sensation bilaterally. Nail Exam: Pt has thick disfigured discolored nails with subungual debris especially fifth toenail left foot. Ulcer Exam: There is no evidence of ulcer or pre-ulcerative changes or infection. Orthopedic Exam: Muscle tone and strength are WNL. No limitations in general ROM. No crepitus or effusions noted. Adducto-varus fifth toes  B/L Bony prominences are unremarkable. Skin: No Porokeratosis. No infection or ulcers  Diagnosis:  Onychomycosis   Treatment & Plan Procedures and Treatment: Consent by patient was obtained for treatment procedures. The patient understood the discussion of treatment and procedures well. All questions were answered thoroughly reviewed. Debridement of mycotic and hypertrophic toenails, 1 through 5 bilateral and clearing of subungual debris. No ulceration, no infection noted.  Return Visit-Office Procedure: Patient instructed to return to the office for a follow up visit 3 months for continued evaluation and treatment.    Gardiner Barefoot DPM

## 2022-02-04 ENCOUNTER — Emergency Department: Payer: Medicare HMO

## 2022-02-04 ENCOUNTER — Inpatient Hospital Stay
Admission: EM | Admit: 2022-02-04 | Discharge: 2022-02-07 | DRG: 291 | Disposition: A | Discharge status: Home / Self Care | Payer: Medicare HMO | Attending: Internal Medicine | Admitting: Internal Medicine

## 2022-02-04 DIAGNOSIS — Z6830 Body mass index (BMI) 30.0-30.9, adult: Secondary | ICD-10-CM

## 2022-02-04 DIAGNOSIS — Z20822 Contact with and (suspected) exposure to covid-19: Secondary | ICD-10-CM | POA: Diagnosis present

## 2022-02-04 DIAGNOSIS — Z794 Long term (current) use of insulin: Secondary | ICD-10-CM | POA: Diagnosis not present

## 2022-02-04 DIAGNOSIS — Z880 Allergy status to penicillin: Secondary | ICD-10-CM | POA: Diagnosis not present

## 2022-02-04 DIAGNOSIS — J9601 Acute respiratory failure with hypoxia: Secondary | ICD-10-CM | POA: Diagnosis present

## 2022-02-04 DIAGNOSIS — Z90710 Acquired absence of both cervix and uterus: Secondary | ICD-10-CM | POA: Diagnosis not present

## 2022-02-04 DIAGNOSIS — I214 Non-ST elevation (NSTEMI) myocardial infarction: Secondary | ICD-10-CM | POA: Diagnosis present

## 2022-02-04 DIAGNOSIS — I509 Heart failure, unspecified: Secondary | ICD-10-CM

## 2022-02-04 DIAGNOSIS — E1169 Type 2 diabetes mellitus with other specified complication: Principal | ICD-10-CM

## 2022-02-04 DIAGNOSIS — I161 Hypertensive emergency: Secondary | ICD-10-CM | POA: Diagnosis present

## 2022-02-04 DIAGNOSIS — Z91148 Patient's other noncompliance with medication regimen for other reason: Secondary | ICD-10-CM

## 2022-02-04 DIAGNOSIS — N189 Chronic kidney disease, unspecified: Secondary | ICD-10-CM | POA: Diagnosis present

## 2022-02-04 DIAGNOSIS — Z888 Allergy status to other drugs, medicaments and biological substances status: Secondary | ICD-10-CM

## 2022-02-04 DIAGNOSIS — R0602 Shortness of breath: Secondary | ICD-10-CM | POA: Diagnosis not present

## 2022-02-04 DIAGNOSIS — Z8249 Family history of ischemic heart disease and other diseases of the circulatory system: Secondary | ICD-10-CM | POA: Diagnosis not present

## 2022-02-04 DIAGNOSIS — R6520 Severe sepsis without septic shock: Secondary | ICD-10-CM

## 2022-02-04 DIAGNOSIS — I130 Hypertensive heart and chronic kidney disease with heart failure and stage 1 through stage 4 chronic kidney disease, or unspecified chronic kidney disease: Secondary | ICD-10-CM | POA: Diagnosis not present

## 2022-02-04 DIAGNOSIS — Z961 Presence of intraocular lens: Secondary | ICD-10-CM | POA: Diagnosis present

## 2022-02-04 DIAGNOSIS — E669 Obesity, unspecified: Secondary | ICD-10-CM | POA: Diagnosis present

## 2022-02-04 DIAGNOSIS — I248 Other forms of acute ischemic heart disease: Secondary | ICD-10-CM | POA: Diagnosis present

## 2022-02-04 DIAGNOSIS — E119 Type 2 diabetes mellitus without complications: Secondary | ICD-10-CM

## 2022-02-04 DIAGNOSIS — A419 Sepsis, unspecified organism: Secondary | ICD-10-CM | POA: Diagnosis not present

## 2022-02-04 DIAGNOSIS — Z86711 Personal history of pulmonary embolism: Secondary | ICD-10-CM

## 2022-02-04 DIAGNOSIS — E1122 Type 2 diabetes mellitus with diabetic chronic kidney disease: Secondary | ICD-10-CM | POA: Diagnosis present

## 2022-02-04 DIAGNOSIS — E785 Hyperlipidemia, unspecified: Secondary | ICD-10-CM | POA: Diagnosis present

## 2022-02-04 DIAGNOSIS — I129 Hypertensive chronic kidney disease with stage 1 through stage 4 chronic kidney disease, or unspecified chronic kidney disease: Secondary | ICD-10-CM | POA: Diagnosis present

## 2022-02-04 DIAGNOSIS — Z7982 Long term (current) use of aspirin: Secondary | ICD-10-CM

## 2022-02-04 DIAGNOSIS — I5033 Acute on chronic diastolic (congestive) heart failure: Secondary | ICD-10-CM | POA: Diagnosis present

## 2022-02-04 DIAGNOSIS — N184 Chronic kidney disease, stage 4 (severe): Secondary | ICD-10-CM | POA: Diagnosis present

## 2022-02-04 DIAGNOSIS — Z8616 Personal history of COVID-19: Secondary | ICD-10-CM

## 2022-02-04 DIAGNOSIS — Z9841 Cataract extraction status, right eye: Secondary | ICD-10-CM

## 2022-02-04 DIAGNOSIS — N2581 Secondary hyperparathyroidism of renal origin: Secondary | ICD-10-CM | POA: Diagnosis present

## 2022-02-04 DIAGNOSIS — D631 Anemia in chronic kidney disease: Secondary | ICD-10-CM | POA: Diagnosis present

## 2022-02-04 DIAGNOSIS — N179 Acute kidney failure, unspecified: Secondary | ICD-10-CM | POA: Diagnosis present

## 2022-02-04 DIAGNOSIS — Z8673 Personal history of transient ischemic attack (TIA), and cerebral infarction without residual deficits: Secondary | ICD-10-CM | POA: Diagnosis not present

## 2022-02-04 DIAGNOSIS — R0609 Other forms of dyspnea: Secondary | ICD-10-CM | POA: Diagnosis not present

## 2022-02-04 DIAGNOSIS — Z79899 Other long term (current) drug therapy: Secondary | ICD-10-CM

## 2022-02-04 LAB — URINALYSIS, COMPLETE (UACMP) WITH MICROSCOPIC
Bacteria, UA: NONE SEEN
Bilirubin Urine: NEGATIVE
Glucose, UA: 50 mg/dL — AB
Ketones, ur: NEGATIVE mg/dL
Leukocytes,Ua: NEGATIVE
Nitrite: NEGATIVE
Protein, ur: 100 mg/dL — AB
Specific Gravity, Urine: 1.008 (ref 1.005–1.030)
pH: 7 (ref 5.0–8.0)

## 2022-02-04 LAB — CBC
HCT: 34 % — ABNORMAL LOW (ref 36.0–46.0)
Hemoglobin: 10 g/dL — ABNORMAL LOW (ref 12.0–15.0)
MCH: 25.1 pg — ABNORMAL LOW (ref 26.0–34.0)
MCHC: 29.4 g/dL — ABNORMAL LOW (ref 30.0–36.0)
MCV: 85.2 fL (ref 80.0–100.0)
Platelets: 266 10*3/uL (ref 150–400)
RBC: 3.99 MIL/uL (ref 3.87–5.11)
RDW: 17 % — ABNORMAL HIGH (ref 11.5–15.5)
WBC: 13.1 10*3/uL — ABNORMAL HIGH (ref 4.0–10.5)
nRBC: 0 % (ref 0.0–0.2)

## 2022-02-04 LAB — CBG MONITORING, ED: Glucose-Capillary: 93 mg/dL (ref 70–99)

## 2022-02-04 LAB — BASIC METABOLIC PANEL
Anion gap: 10 (ref 5–15)
BUN: 39 mg/dL — ABNORMAL HIGH (ref 8–23)
CO2: 19 mmol/L — ABNORMAL LOW (ref 22–32)
Calcium: 8.9 mg/dL (ref 8.9–10.3)
Chloride: 112 mmol/L — ABNORMAL HIGH (ref 98–111)
Creatinine, Ser: 3.07 mg/dL — ABNORMAL HIGH (ref 0.44–1.00)
GFR, Estimated: 16 mL/min — ABNORMAL LOW (ref >60)
Glucose, Bld: 117 mg/dL — ABNORMAL HIGH (ref 70–99)
Potassium: 5.1 mmol/L (ref 3.5–5.1)
Sodium: 141 mmol/L (ref 135–145)

## 2022-02-04 LAB — HIV ANTIBODY (ROUTINE TESTING W REFLEX): HIV Screen 4th Generation wRfx: NONREACTIVE

## 2022-02-04 LAB — TROPONIN I (HIGH SENSITIVITY)
Troponin I (High Sensitivity): 22 ng/L — ABNORMAL HIGH (ref <18)
Troponin I (High Sensitivity): 22 ng/L — ABNORMAL HIGH (ref <18)

## 2022-02-04 LAB — LACTIC ACID, PLASMA: Lactic Acid, Venous: 1 mmol/L (ref 0.5–1.9)

## 2022-02-04 LAB — BRAIN NATRIURETIC PEPTIDE: B Natriuretic Peptide: 271 pg/mL — ABNORMAL HIGH (ref 0.0–100.0)

## 2022-02-04 LAB — PHOSPHORUS: Phosphorus: 3.2 mg/dL (ref 2.5–4.6)

## 2022-02-04 LAB — MAGNESIUM: Magnesium: 2.2 mg/dL (ref 1.7–2.4)

## 2022-02-04 LAB — PROCALCITONIN: Procalcitonin: <0.1 ng/mL

## 2022-02-04 MED ORDER — FUROSEMIDE 10 MG/ML IJ SOLN
60.0000 mg | Freq: Once | INTRAMUSCULAR | Status: AC
  Administered 2022-02-04: 60 mg via INTRAVENOUS
  Filled 2022-02-04: qty 8

## 2022-02-04 MED ORDER — CALCITRIOL 0.25 MCG PO CAPS
0.2500 ug | ORAL_CAPSULE | Freq: Every day | ORAL | Status: DC
  Administered 2022-02-04 – 2022-02-07 (×4): 0.25 ug via ORAL
  Filled 2022-02-04 (×4): qty 1

## 2022-02-04 MED ORDER — INSULIN ASPART 100 UNIT/ML IJ SOLN: 0.0000 [IU] | Freq: Every day | INTRAMUSCULAR | Status: DC

## 2022-02-04 MED ORDER — ACETAMINOPHEN 325 MG PO TABS: 650.0000 mg | ORAL_TABLET | Freq: Four times a day (QID) | ORAL | Status: DC | PRN

## 2022-02-04 MED ORDER — INSULIN ASPART 100 UNIT/ML IJ SOLN
0.0000 [IU] | Freq: Three times a day (TID) | INTRAMUSCULAR | Status: DC
  Administered 2022-02-05: 1 [IU] via SUBCUTANEOUS
  Administered 2022-02-05: 2 [IU] via SUBCUTANEOUS
  Filled 2022-02-04 (×2): qty 1

## 2022-02-04 MED ORDER — LABETALOL HCL 5 MG/ML IV SOLN
20.0000 mg | Freq: Once | INTRAVENOUS | Status: AC
  Administered 2022-02-04: 20 mg via INTRAVENOUS
  Filled 2022-02-04: qty 4

## 2022-02-04 MED ORDER — HEPARIN SODIUM (PORCINE) 5000 UNIT/ML IJ SOLN
5000.0000 [IU] | Freq: Three times a day (TID) | INTRAMUSCULAR | Status: DC
  Administered 2022-02-04 – 2022-02-07 (×10): 5000 [IU] via SUBCUTANEOUS
  Filled 2022-02-04 (×10): qty 1

## 2022-02-04 MED ORDER — CARVEDILOL PHOSPHATE ER 10 MG PO CP24
80.0000 mg | ORAL_CAPSULE | Freq: Every day | ORAL | Status: DC
  Administered 2022-02-04 – 2022-02-07 (×4): 80 mg via ORAL
  Filled 2022-02-04 (×4): qty 8

## 2022-02-04 MED ORDER — SODIUM CHLORIDE 0.9 % IV SOLN
2.0000 g | INTRAVENOUS | Status: DC
  Administered 2022-02-04: 2 g via INTRAVENOUS
  Filled 2022-02-04: qty 20

## 2022-02-04 MED ORDER — INSULIN GLARGINE-YFGN 100 UNIT/ML ~~LOC~~ SOLN
28.0000 [IU] | Freq: Every day | SUBCUTANEOUS | Status: DC
Start: 2022-02-04 — End: 2022-02-07
  Administered 2022-02-04 – 2022-02-07 (×4): 28 [IU] via SUBCUTANEOUS
  Filled 2022-02-04 (×4): qty 0.28

## 2022-02-04 MED ORDER — ONDANSETRON HCL 4 MG PO TABS: 4.0000 mg | ORAL_TABLET | Freq: Four times a day (QID) | ORAL | Status: DC | PRN

## 2022-02-04 MED ORDER — INSULIN ASPART 100 UNIT/ML IJ SOLN: 0.0000 [IU] | Freq: Three times a day (TID) | INTRAMUSCULAR | Status: DC

## 2022-02-04 MED ORDER — ONDANSETRON HCL 4 MG/2ML IJ SOLN: 4.0000 mg | Freq: Four times a day (QID) | INTRAMUSCULAR | Status: DC | PRN

## 2022-02-04 MED ORDER — HYDRALAZINE HCL 50 MG PO TABS
100.0000 mg | ORAL_TABLET | Freq: Three times a day (TID) | ORAL | Status: DC
  Administered 2022-02-04 – 2022-02-07 (×9): 100 mg via ORAL
  Filled 2022-02-04 (×9): qty 2

## 2022-02-04 MED ORDER — AMLODIPINE BESYLATE 10 MG PO TABS
10.0000 mg | ORAL_TABLET | Freq: Every day | ORAL | Status: DC
  Administered 2022-02-04 – 2022-02-07 (×4): 10 mg via ORAL
  Filled 2022-02-04: qty 2
  Filled 2022-02-04: qty 1
  Filled 2022-02-04: qty 2
  Filled 2022-02-04: qty 1

## 2022-02-04 MED ORDER — SODIUM CHLORIDE 0.9 % IV SOLN
500.0000 mg | INTRAVENOUS | Status: DC
  Administered 2022-02-04: 500 mg via INTRAVENOUS
  Filled 2022-02-04: qty 5

## 2022-02-04 MED ORDER — ACETAMINOPHEN 650 MG RE SUPP: 650.0000 mg | Freq: Four times a day (QID) | RECTAL | Status: DC | PRN

## 2022-02-04 NOTE — ED Provider Triage Note (Signed)
Emergency Medicine Provider Triage Evaluation Note  Katrina Daddona , a 71 y.o. female  was evaluated in triage.  Pt complains of shortness of breath and leg swelling.  Was seen at PCPs office and told that her oxygen level was low.  Was advised to come to the ED.  Review of Systems  Positive: + SOB Negative: - CP, - fever, - h/a, - smoker  Physical Exam  There were no vitals taken for this visit. Gen:   Awake, no distress, able to answer questions in complete sentences. Resp:  Normal effort, Bilateral expiratory wheeze.  Mild rales bilaterally but noted more on the left than the right MSK:   Moves extremities without difficulty. Lower extremities with minimal pitting edema ankles but not noted proximally. Other:    Medical Decision Making  Medically screening exam initiated at 12:18 PM.  Appropriate orders placed.  Anaaya Fuster was informed that the remainder of the evaluation will be completed by another provider, this initial triage assessment does not replace that evaluation, and the importance of remaining in the ED until their evaluation is complete.     Johnn Hai, PA-C 02/04/22 1227

## 2022-02-04 NOTE — ED Notes (Signed)
Pt provided with fresh linens and purewick d/t overflow from cannister leaking onto bed. Pt able to provide pericare to herself.

## 2022-02-04 NOTE — Assessment & Plan Note (Addendum)
-   Insulin-dependent diabetes mellitus - Resumed home Tresiba/long-acting insulin, 36 units daily - Insulin SSI with at bedtime coverage ordered - Goal inpatient blood glucose levels 140-180

## 2022-02-04 NOTE — H&P (Signed)
History and Physical   Apurva Reily CVE:[id number] DOB: Jan 02, 1951 DOA: 02/04/2022  PCP: [facility]  Outpatient Specialists:  Patient coming from: Kidney doctor clinic via [place]  I have personally briefly reviewed patient's old medical records in [place].  Chief Concern: Hypoxia per outpatient clinic  HPI: [name] is a 71 year old female with history of CKD stage IV, insulin-dependent diabetes mellitus, hypertension, who presents emergency department for chief concerns of hypoxia at outpatient clinic.  Per ED report, patient's saturation was in the high 80s at outpatient clinic prompting outpatient clinic to send patient to the emergency department for further evaluation.  Initial vitals in the emergency department showed temperature of 98.1, respiration rate of 18, heart rate of 81, blood pressure 211/99, SPO2 of 90% on room air.  Serum sodium is 1 41, potassium 5.1, chloride of 112, bicarb 19, BUN of 39, serum creatinine of 3.07, GFR 16, nonfasting blood glucose 117, WBC 13.1, platelet platelets of 266, hemoglobin 10.  High sensitive troponin is 22 and on repeat was also 22  ED treatment: Lasix 60 mg IV one-time dose, labetalol 20 mg IV one-time dose  She is aao to self, age, current location, and current calendar.   She reports at the nephrology clinic she was hypertensive with O2 dropping.  She states that she has been having swelling of her lower extremities (both ankles) since Saturday and shortness of breath with laying flat.   She reports she felt similar to when she had covid. She endorses feeling tired. She denies fever, nausea, vomiting, chest pain, abdominal pain. She denies dysuria, hematuria, diarrhea, syncope.   Water: six 8 oz cups per day. Tea: once per coffee Milk: only with cereal Juice: cranberry infrequently Soda: none Coffee: none  Social history: She lives by herself. She denies tobacco use, etoh, and recreational drug use. She is  retired and formerly worked at [employer].  ROS: Constitutional: no weight change, no fever ENT/Mouth: no sore throat, no rhinorrhea Eyes: no eye pain, no vision changes Cardiovascular: no chest pain, + dyspnea,  + edema, no palpitations Respiratory: no cough, no sputum, no wheezing Gastrointestinal: no nausea, no vomiting, no diarrhea, no constipation Genitourinary: no urinary incontinence, no dysuria, no hematuria Musculoskeletal: no arthralgias, no myalgias Skin: no skin lesions, no pruritus, Neuro: + weakness, no loss of consciousness, no syncope Psych: no anxiety, no depression, + decrease appetite Heme/Lymph: no bruising, no bleeding  ED Course: Discussed with emergency medicine provider, patient requiring hospitalization for chief concerns of acute hypoxic respiratory failure.  Assessment/Plan  Principal Problem:   Severe sepsis with acute organ dysfunction (HCC) Active Problems:   HLD (hyperlipidemia)   Diabetes mellitus, type 2 (HCC)   Anemia in chronic kidney disease   Benign hypertensive kidney disease with chronic kidney disease   NSTEMI (non-ST elevated myocardial infarction) (HCC)   Chronic kidney disease, stage 4 (severe) (HCC)   Hypertensive emergency   Acute respiratory failure with hypoxia (HCC)   Assessment and Plan:  * Severe sepsis with acute organ dysfunction (HCC) - Check blood cultures x2 and urine culture - Check procalcitonin, COVID PCR, lactic acid x2 - Patient is presenting with pulmonary congestion on portable chest x-ray and currently maintaining appropriate MAP in fact her vitals show hypertensive urgency/emergency therefore no IV fluid bolus is given at this time - Consistent with sepsis treatment we will maintain goal of MAP greater than 65 - Ceftriaxone and azithromycin IV, 5 days ordered - Admit to inpatient, progressive  Acute respiratory failure with hypoxia (HCC) - Per report patient saturation was in the mid to high  80s on room air - Patient has no baseline requirement for O2 supplementation - Continue 2 L nasal cannula to maintain SPO2 greater than 92% - Etiology is multifactorial including pulmonary edema in setting of possible right middle to lower lobe pneumonia - Patient's symptoms do not improve with IV antibiotic and O2 supplementation, consideration can be given to starting patient on BiPAP nightly  Hypertensive emergency - Presumed secondary to severe sepsis in setting of possible pneumonia and not taking her antihypertensive medications prior to ED presentation - Resumed home antihypertensive medications including amlodipine 10 mg daily, carvedilol 80 mg twice daily, hydralazine 100 mg p.o. 3 times daily  NSTEMI (non-ST elevated myocardial infarction) ([place]) - Presumed secondary to demand ischemia in setting of hypertensive urgency/emergency - No indication for heparin GTT at this time  Diabetes mellitus, type 2 ([place]) - Insulin-dependent diabetes mellitus - Resumed home Tresiba/long-acting insulin, 36 units daily - Insulin SSI with at bedtime coverage ordered - Goal inpatient blood glucose levels 140-180  Chart reviewed.   DVT prophylaxis: Heparin 5000 units subcutaneous every 8 hours Code Status: full code Diet: Heart healthy/carb modified Family Communication: a phone call was offered, patient declined Disposition Plan: Pending clinical course Consults called: Pharmacy Admission status: Progressive cardiac, inpatient  Past Medical History:  Diagnosis Date   COVID-19 12/2018   Diabetes mellitus without complication ([place])    Hyperlipidemia    Hypertension    Pulmonary embolism ([place]) 12/2018   Stroke ([place])    over 25 yrs ago.  No deficits   Wears dentures    partial upper   Past Surgical History:  Procedure Laterality Date   ABDOMINAL HYSTERECTOMY     CATARACT EXTRACTION W/PHACO Left 01/03/2020   Procedure: CATARACT EXTRACTION PHACO AND INTRAOCULAR LENS PLACEMENT (IOC) LEFT  DIABETIC 1.82  00:20.7;  Surgeon: Eulogio Bear, MD;  Location: [facility];  Service: Ophthalmology;  Laterality: Left;  Diabetic - insulin and oral meds   COLONOSCOPY     Social History:  reports that she has never smoked. She has never used smokeless tobacco. She reports that she does not currently use alcohol. She reports that she does not use drugs.  Allergies  Allergen Reactions   Atorvastatin Rash   Penicillins Rash   Family History  Problem Relation Age of Onset   Heart disease Mother    Cancer Father    Family history: Family history reviewed and not pertinent  Prior to Admission medications   Medication Sig Start Date End Date Taking? Authorizing Provider  acetaminophen (TYLENOL) 325 MG tablet Take by mouth. 01/19/19   [provider]  amLODipine (NORVASC) 10 MG tablet Take by mouth 2 (two) times daily.  01/20/19   [provider]  amLODipine (NORVASC) 10 MG tablet Take 1 tablet by mouth daily. 06/27/20   [provider]  aspirin 81 MG EC tablet Take by mouth. 06/05/17   [provider]  aspirin EC 81 MG tablet Take by mouth. 06/05/17   [provider]  B-D ULTRA-FINE 33 LANCETS MISC Test qd Use as directed.E 11.9 01/20/19   [provider]  Blood Glucose Monitoring Suppl (FIFTY50 GLUCOSE METER 2.0) w/Device KIT Check BG once daily as needed Dx E11.9 01/20/19   [provider]  Blood Glucose Monitoring Suppl (ONETOUCH VERIO) w/Device KIT  01/21/19   [provider]  calcitRIOL (ROCALTROL) 0.25 MCG capsule Take 0.25  mcg by mouth daily. 08/07/21   [provider]  carvedilol (COREG CR) 80 MG 24 hr capsule Take 80 mg by mouth daily. 08/08/21   [provider]  carvedilol (COREG) 25 MG tablet Take 25 mg by mouth 2 (two) times daily. 06/17/18   [provider]  cloNIDine (CATAPRES) 0.1 MG tablet Take 0.1 mg by mouth 3 (three) times daily. 07/12/21   [provider]  ferrous  sulfate 324 MG TBEC Take 324 mg by mouth.    [provider]  glucose blood (PRECISION QID TEST) test strip Check BG twicec daily as needed Dx E11.9 01/20/19   [provider]  hydrALAZINE (APRESOLINE) 100 MG tablet Take 100 mg by mouth 3 (three) times daily. 08/28/21   [provider]  hydrochlorothiazide (HYDRODIURIL) 12.5 MG tablet Take 12.5 mg by mouth daily.    [provider]  Insulin Degludec (TRESIBA FLEXTOUCH) 200 UNIT/ML SOPN [The details of the medication are not available because there are pending changes by a home health clinician.] 01/20/19   [provider]  Insulin Pen Needle (FIFTY50 PEN NEEDLES) 31G X 8 MM MISC by Does not apply route. 01/20/19   [provider]  lisinopril (ZESTRIL) 20 MG tablet Take by mouth. 01/20/19   [provider]  losartan (COZAAR) 100 MG tablet Take 100 mg by mouth daily. 05/12/20   [provider]  metFORMIN (GLUCOPHAGE) 1000 MG tablet Take by mouth. Patient not taking: Reported on 08/30/2021 01/20/19   [provider]  metFORMIN (GLUCOPHAGE-XR) 750 MG 24 hr tablet Take 750 mg by mouth every morning. Patient not taking: Reported on 08/30/2021 08/13/21   [provider]  Omega-3 Fatty Acids (FISH OIL) 1000 MG CAPS Take by mouth.    [provider]  torsemide (DEMADEX) 10 MG tablet Take 10 mg by mouth daily. 05/30/20   [provider]   Physical Exam: Vitals:   02/04/22 1730 02/04/22 1745 02/04/22 1800 02/04/22 1914  BP: (!) 199/95 (!) 197/95 (!) 176/97 (!) 190/91  Pulse: 75 73 69 74  Resp: (!) 23 (!) _0 Temp:    99.3 F (37.4 C)  SpO2: 90% 93% 96% 97%  Weight:      Height:       Constitutional: appears age appropriate, NAD, calm, comfortable Eyes: PERRL, lids and conjunctivae normal ENMT: Mucous membranes are moist. Posterior pharynx clear of any exudate or lesions. Age-appropriate dentition. Hearing appropriate Neck: normal, supple, no masses, no  thyromegaly Respiratory: clear to auscultation bilaterally, no wheezing, no crackles. Normal respiratory effort. No accessory muscle use.  Cardiovascular: Regular rate and rhythm, no murmurs / rubs / gallops. Bilateral trace lower extremity edema. 2+ pedal pulses. No carotid bruits.  Abdomen: obese abdomen, no tenderness, no masses palpated, no hepatosplenomegaly. Bowel sounds positive.  Musculoskeletal: no clubbing / cyanosis. No joint deformity upper and lower extremities. Good ROM, no contractures, no atrophy. Normal muscle tone.  Skin: no rashes, lesions, ulcers. No induration Neurologic: Sensation intact. Strength 5/5 in all 4.  Psychiatric: Normal judgment and insight. Alert and oriented x 3. Normal mood.   EKG: independently reviewed, showing sinus rhythm with rate of 81, QTc 439  Chest x-ray on Admission: I personally reviewed and I agree with radiologist reading as below with the addition of possible mid to lower right sided pneumonia  DG Chest 2 View  Result Date: 02/04/2022 CLINICAL DATA:  Short of breath EXAM: CHEST - 2 VIEW COMPARISON:  03/02/2021 FINDINGS: Patchy  opacities bilaterally, right greater than left. Trace pleural effusions. No pneumothorax. Stable mild cardiomegaly. IMPRESSION: Cardiomegaly, trace pleural effusions, and patchy opacities bilaterally reflecting edema or less likely pneumonia. Electronically Signed   By: Macy Mis M.D.   On: 02/04/2022 13:10    Labs on Admission: I have personally reviewed following labs  CBC: Recent Labs  Lab 02/04/22 1206  WBC 13.1*  HGB 10.0*  HCT 34.0*  MCV 85.2  PLT 324   Basic Metabolic Panel: Recent Labs  Lab 02/04/22 1206 02/04/22 1659  NA 141  --   K 5.1  --   CL 112*  --   CO2 19*  --   GLUCOSE 117*  --   BUN 39*  --   CREATININE 3.07*  --   CALCIUM 8.9  --   MG  --  2.2  PHOS  --  3.2   GFR: Estimated Creatinine Clearance: 20.9 mL/min (A) (by C-G formula based on SCr of 3.07 mg/dL (H)).  CRITICAL  CARE Performed by: Dr. Tobie Poet  Total critical care time: 35 minutes  Critical care time was exclusive of separately billable procedures and treating other patients.  Critical care was necessary to treat or prevent imminent or life-threatening deterioration.  Critical care was time spent personally by me on the following activities: development of treatment plan with patient and/or surrogate as well as nursing, discussions with consultants, evaluation of patient's response to treatment, examination of patient, obtaining history from patient or surrogate, ordering and performing treatments and interventions, ordering and review of laboratory studies, ordering and review of radiographic studies, pulse oximetry and re-evaluation of patient's condition.  Dr. Tobie Poet [facility]  If 7PM-7AM, please contact overnight-coverage provider If 7AM-7PM, please contact day coverage provider [email or web address]  02/04/2022, 8:17 PM

## 2022-02-04 NOTE — Assessment & Plan Note (Signed)
-   Presumed secondary to severe sepsis in setting of possible pneumonia and not taking her antihypertensive medications prior to ED presentation - Resumed home antihypertensive medications including amlodipine 10 mg daily, carvedilol 80 mg twice daily, hydralazine 100 mg p.o. 3 times daily

## 2022-02-04 NOTE — ED Triage Notes (Addendum)
Pt to ED via POV from kidney doctor. Pt reports her oxygen levels were low at the doctor and was referred to come into the ED.   Pt endorses SOB and increased swelling in her ankles. RA sats 90%. Pt reports she takes BP medication but did not take it today. Pt with hx HTN, CKD stage IV and aortic aneurysm

## 2022-02-04 NOTE — ED Provider Notes (Signed)
[facility] Provider Note    Event Date/Time   First MD Initiated Contact with Patient 02/04/22 1557     (approximate)  History   Chief Complaint: Shortness of Breath  HPI  Junior Kenedy is a 71 y.o. female with a past medical history of diabetes, hypertension, hyperlipidemia, CKD, CHF, presents to the emergency department for worsening shortness of breath.  According to the patient since last night she has had progressively worsening shortness of breath.  She went to her doctor today and was found to be hypoxic in the upper 80s and was sent to the emergency department for evaluation.  Currently during my evaluation patient is satting 87 to 88% on room air.  Patient denies any fever denies any significant cough.  Patient has noted increased lower extremity edema especially in her ankles.  States last night she was having a lot of trouble lying flat due to shortness of breath.  Patient found to be hypertensive 211/99 in the emergency department.  Physical Exam   Triage Vital Signs: ED Triage Vitals  Enc Vitals Group     BP 02/04/22 1221 (!) 204/143     Pulse Rate 02/04/22 1221 81     Resp 02/04/22 1221 18     Temp 02/04/22 1221 98.1 F (36.7 C)     Temp src --      SpO2 02/04/22 1221 90 %     Weight 02/04/22 1225 209 lb (94.8 kg)     Height 02/04/22 1225 5\' 9"  (1.753 m)     Head Circumference --      Peak Flow --      Pain Score 02/04/22 1218 0     Pain Loc --      Pain Edu? --      Excl. in [place]? --     Most recent vital signs: Vitals:   02/04/22 1221 02/04/22 1225  BP: (!) 204/143 (!) 211/99  Pulse: 81   Resp: 18   Temp: 98.1 F (36.7 C)   SpO2: 90%     General: Awake, no distress.  CV:  Good peripheral perfusion.  Regular rate and rhythm  Resp:  Normal effort.  Equal breath sounds bilaterally.  Abd:  No distention.  Soft, nontender.  No rebound or guarding. Other:  1+ lower extremity edema more so in the ankles.   ED Results / Procedures /  Treatments   EKG  EKG viewed and interpreted by myself shows a normal sinus rhythm at 81 bpm with a narrow QRS, normal axis, normal intervals, no concerning ST changes.  RADIOLOGY  I personally reviewed and interpreted the patient's chest x-ray images.  Patient appears to have opacities bilaterally as well as fluid within the fissures consistent with pulmonary edema. Radiology is read the x-ray is cardiomegaly with pleural effusions and patchy opacities most consistent with edema.   MEDICATIONS ORDERED IN ED: Medications  furosemide (LASIX) injection 60 mg (has no administration in time range)  labetalol (NORMODYNE) injection 20 mg (has no administration in time range)     IMPRESSION / MDM / ASSESSMENT AND PLAN / ED COURSE  I reviewed the triage vital signs and the nursing notes.  Patient's presentation is most consistent with acute presentation with potential threat to life or bodily function.  Patient presents emergency department for worsening shortness of breath since yesterday.  Here patient found to be quite hypertensive 211/99.  Patient has lower extremity edema.  Patient's chest x-ray shows pulmonary edema.  Lab work shows worsening renal insufficiency on her chemistry, slightly elevated troponin, mild leukocytosis and anemia overall reassuring CBC.  Given the patient's dyspnea currently satting 87% on room air I have placed the patient on 2 L nasal cannula.  Given the patient's chest x-ray findings we will dose IV Lasix.  Given the patient's hypertension we will dose IV labetalol to reduce afterload.  Patient will require admission to the hospital service for ongoing management of her CHF exacerbation.  Patient agreeable to plan of care.  FINAL CLINICAL IMPRESSION(S) / ED DIAGNOSES   CHF exacerbation    Note:  This document was prepared using Dragon voice recognition software and may include unintentional dictation errors.   Harvest Dark, MD 02/04/22 1630

## 2022-02-04 NOTE — Assessment & Plan Note (Signed)
-   Presumed secondary to demand ischemia in setting of hypertensive urgency/emergency - No indication for heparin GTT at this time

## 2022-02-04 NOTE — Hospital Course (Addendum)
[name] is a 71 year old female with history of CKD stage IV, insulin-dependent diabetes mellitus, hypertension, who presents emergency department for chief concerns of hypoxia at outpatient clinic.  Per ED report, patient's saturation was in the high 80s at outpatient clinic prompting outpatient clinic to send patient to the emergency department for further evaluation.  Initial vitals in the emergency department showed temperature of 98.1, respiration rate of 18, heart rate of 81, blood pressure 211/99, SPO2 of 90% on room air.  Serum sodium is 1 41, potassium 5.1, chloride of 112, bicarb 19, BUN of 39, serum creatinine of 3.07, GFR 16, nonfasting blood glucose 117, WBC 13.1, platelet platelets of 266, hemoglobin 10.  High sensitive troponin is 22 and on repeat was also 22  ED treatment: Lasix 60 mg IV one-time dose, labetalol 20 mg IV one-time dose

## 2022-02-04 NOTE — Assessment & Plan Note (Addendum)
-   Per report patient saturation was in the mid to high 80s on room air - Patient has no baseline requirement for O2 supplementation - Continue 2 L nasal cannula to maintain SPO2 greater than 92% - Etiology is multifactorial including pulmonary edema in setting of possible right middle to lower lobe pneumonia - Patient's symptoms do not improve with IV antibiotic and O2 supplementation, consideration can be given to starting patient on BiPAP nightly

## 2022-02-04 NOTE — Assessment & Plan Note (Signed)
-   Check blood cultures x2 and urine culture - Check procalcitonin, COVID PCR, lactic acid x2 - Patient is presenting with pulmonary congestion on portable chest x-ray and currently maintaining appropriate MAP in fact her vitals show hypertensive urgency/emergency therefore no IV fluid bolus is given at this time - Consistent with sepsis treatment we will maintain goal of MAP greater than 65 - Ceftriaxone and azithromycin IV, 5 days ordered - Admit to inpatient, progressive

## 2022-02-05 ENCOUNTER — Other Ambulatory Visit: Payer: Self-pay

## 2022-02-05 ENCOUNTER — Inpatient Hospital Stay (HOSPITAL_COMMUNITY)
Admit: 2022-02-05 | Discharge: 2022-02-05 | Disposition: A | Discharge status: Home / Self Care | Payer: Medicare HMO | Attending: Internal Medicine | Admitting: Internal Medicine

## 2022-02-05 DIAGNOSIS — R0609 Other forms of dyspnea: Secondary | ICD-10-CM

## 2022-02-05 DIAGNOSIS — A419 Sepsis, unspecified organism: Secondary | ICD-10-CM | POA: Diagnosis not present

## 2022-02-05 DIAGNOSIS — R6520 Severe sepsis without septic shock: Secondary | ICD-10-CM | POA: Diagnosis not present

## 2022-02-05 DIAGNOSIS — I509 Heart failure, unspecified: Secondary | ICD-10-CM

## 2022-02-05 DIAGNOSIS — I77810 Thoracic aortic ectasia: Secondary | ICD-10-CM

## 2022-02-05 HISTORY — DX: Thoracic aortic ectasia: I77.810

## 2022-02-05 LAB — CBG MONITORING, ED
Glucose-Capillary: 163 mg/dL — ABNORMAL HIGH (ref 70–99)
Glucose-Capillary: 148 mg/dL — ABNORMAL HIGH (ref 70–99)

## 2022-02-05 LAB — PROTIME-INR
INR: 1.1 (ref 0.8–1.2)
Prothrombin Time: 14.1 seconds (ref 11.4–15.2)

## 2022-02-05 LAB — ECHOCARDIOGRAM COMPLETE
AR max vel: 2.73 cm2
AV Area VTI: 2.59 cm2
AV Area mean vel: 2.64 cm2
AV Mean grad: 6 mmHg
AV Peak grad: 9.5 mmHg
Ao pk vel: 1.54 m/s
Area-P 1/2: 2.33 cm2
Height: 69 in
S' Lateral: 2.08 cm
Weight: 3344 oz

## 2022-02-05 LAB — CORTISOL-AM, BLOOD: Cortisol - AM: 18.1 ug/dL (ref 6.7–22.6)

## 2022-02-05 LAB — SARS CORONAVIRUS 2 BY RT PCR: SARS Coronavirus 2 by RT PCR: NEGATIVE

## 2022-02-05 LAB — CBC
HCT: 30.1 % — ABNORMAL LOW (ref 36.0–46.0)
Hemoglobin: 8.9 g/dL — ABNORMAL LOW (ref 12.0–15.0)
MCH: 25.1 pg — ABNORMAL LOW (ref 26.0–34.0)
MCHC: 29.6 g/dL — ABNORMAL LOW (ref 30.0–36.0)
MCV: 85 fL (ref 80.0–100.0)
Platelets: 256 10*3/uL (ref 150–400)
RBC: 3.54 MIL/uL — ABNORMAL LOW (ref 3.87–5.11)
RDW: 16.9 % — ABNORMAL HIGH (ref 11.5–15.5)
WBC: 9.8 10*3/uL (ref 4.0–10.5)
nRBC: 0 % (ref 0.0–0.2)

## 2022-02-05 LAB — BASIC METABOLIC PANEL
Anion gap: 6 (ref 5–15)
BUN: 40 mg/dL — ABNORMAL HIGH (ref 8–23)
CO2: 22 mmol/L (ref 22–32)
Calcium: 8.5 mg/dL — ABNORMAL LOW (ref 8.9–10.3)
Chloride: 112 mmol/L — ABNORMAL HIGH (ref 98–111)
Creatinine, Ser: 3.02 mg/dL — ABNORMAL HIGH (ref 0.44–1.00)
GFR, Estimated: 16 mL/min — ABNORMAL LOW (ref >60)
Glucose, Bld: 62 mg/dL — ABNORMAL LOW (ref 70–99)
Potassium: 4.5 mmol/L (ref 3.5–5.1)
Sodium: 140 mmol/L (ref 135–145)

## 2022-02-05 LAB — GLUCOSE, CAPILLARY
Glucose-Capillary: 110 mg/dL — ABNORMAL HIGH (ref 70–99)
Glucose-Capillary: 141 mg/dL — ABNORMAL HIGH (ref 70–99)

## 2022-02-05 LAB — HEMOGLOBIN A1C
Hgb A1c MFr Bld: 6.7 % — ABNORMAL HIGH (ref 4.8–5.6)
Mean Plasma Glucose: 145.59 mg/dL

## 2022-02-05 LAB — URINE CULTURE: Culture: NO GROWTH

## 2022-02-05 MED ORDER — FUROSEMIDE 10 MG/ML IJ SOLN
6.0000 mg/h | INTRAMUSCULAR | Status: DC
Start: 2022-02-05 — End: 2022-02-06
  Administered 2022-02-05: 6 mg/h via INTRAVENOUS
  Filled 2022-02-05: qty 20

## 2022-02-05 NOTE — Progress Notes (Signed)
[facility]  ROUNDING NOTE   Subjective:   Dorothy Walton is a 71 y.o female with past medical history of diabetes, hypertension, and chronic kidney disease stage IV. Patient presents to the ED with shortness of breath from outpatient clinic. She has been admitted for Severe sepsis with acute organ dysfunction ([place]) [A41.9, R65.20] Acute decompensated heart failure ([place]) [I50.9]  Patient is known to our practice and is followed by  Dr Holley Raring. Patient was last seen in office yesterday. Office notes reports patient was in respiratory distress at office with O2 sats in the upper 80s-lower 90s. She was encouraged to present to ED for further management of symptoms. It was discussed at that appt the potential to initiate dialysis however patient remained hesitant. Patient appears in good spirits today. Seen at bedside in ED. No lower extremity edema, remains on 2L O2. Denies pain or chest discomfort. Denies known fever or chills.   Labs on arrival include sodium 140, potassium 4.5, glucose 62, BUN 40, creatinine 3.02 with GFR 16, and hemoglobin 8.9.  Echo completed today shows EF greater than 75%, severe LVH, and grade 1 diastolic dysfunction.  We have been consulted to help manage fluid volume status.   Objective:  Vital signs in last 24 hours:  Temp:  [97.8 F (36.6 C)-99.3 F (37.4 C)] 98.4 F (36.9 C) (07/18 1600) Pulse Rate:  [64-84] 84 (07/18 1600) Resp:  [14-31] 18 (07/18 1600) BP: (122-199)/(74-108) 148/80 (07/18 1600) SpO2:  [90 %-99 %] 92 % (07/18 1600)  Weight change:  Filed Weights   02/04/22 1225  Weight: 94.8 kg    Intake/Output: I/O last 3 completed shifts: In: 100 [IV Piggyback:100] Out: 9937 [Urine:2350]   Intake/Output this shift:  No intake/output data recorded.  Physical Exam: General: NAD, resting comfortably  Head: Normocephalic, atraumatic. Moist oral mucosal membranes  Eyes: Anicteric  Lungs:  Crackles and rhonchi throughout, normal effort, [place]  O2  Heart: Regular rate and rhythm  Abdomen:  Soft, nontender  Extremities: No peripheral edema.  Neurologic: Nonfocal, moving all four extremities  Skin: No lesions  Access: None    Basic Metabolic Panel: Recent Labs  Lab 02/04/22 1206 02/04/22 1659 02/05/22 0505  NA 141  --  140  K 5.1  --  4.5  CL 112*  --  112*  CO2 19*  --  22  GLUCOSE 117*  --  62*  BUN 39*  --  40*  CREATININE 3.07*  --  3.02*  CALCIUM 8.9  --  8.5*  MG  --  2.2  --   PHOS  --  3.2  --     Liver Function Tests: No results for input(s): "AST", "ALT", "ALKPHOS", "BILITOT", "PROT", "ALBUMIN" in the last 168 hours. No results for input(s): "LIPASE", "AMYLASE" in the last 168 hours. No results for input(s): "AMMONIA" in the last 168 hours.  CBC: Recent Labs  Lab 02/04/22 1206 02/05/22 0505  WBC 13.1* 9.8  HGB 10.0* 8.9*  HCT 34.0* 30.1*  MCV 85.2 85.0  PLT 266 256    Cardiac Enzymes: No results for input(s): "CKTOTAL", "CKMB", "CKMBINDEX", "TROPONINI" in the last 168 hours.  BNP: Invalid input(s): "POCBNP"  CBG: Recent Labs  Lab 02/04/22 2231 02/05/22 0908 02/05/22 1153  GLUCAP 93 163* 148*    Microbiology: Results for orders placed or performed during the hospital encounter of 02/04/22  Culture, blood (Routine X 2) w Reflex to ID Panel     Status: None (Preliminary result)   Collection  Time: 02/04/22  5:15 PM   Specimen: BLOOD  Result Value Ref Range Status   Specimen Description BLOOD BLOOD LEFT HAND  Final   Special Requests AEROBIC BOTTLE ONLY Blood Culture adequate volume  Final   Culture   Final    NO GROWTH < 24 HOURS Performed at [facility], [address] [place], [place] [address]    Report Status PENDING  Incomplete  Culture, blood (Routine X 2) w Reflex to ID Panel     Status: None (Preliminary result)   Collection Time: 02/04/22  5:22 PM   Specimen: BLOOD  Result Value Ref Range Status   Specimen Description BLOOD LEFT ANTECUBITAL  Final    Special Requests   Final    BOTTLES DRAWN AEROBIC AND ANAEROBIC Blood Culture results may not be optimal due to an excessive volume of blood received in culture bottles   Culture   Final    NO GROWTH < 24 HOURS Performed at [facility], [address] [place], [place] [address]    Report Status PENDING  Incomplete  SARS Coronavirus 2 by RT PCR (hospital order, performed in [place] hospital lab) *cepheid single result test* Anterior Nasal Swab     Status: None   Collection Time: 02/05/22  3:13 PM   Specimen: Anterior Nasal Swab  Result Value Ref Range Status   SARS Coronavirus 2 by RT PCR NEGATIVE NEGATIVE Final    Comment: (NOTE) SARS-CoV-2 target nucleic acids are NOT DETECTED.  The SARS-CoV-2 RNA is generally detectable in upper and lower respiratory specimens during the acute phase of infection. The lowest concentration of SARS-CoV-2 viral copies this assay can detect is 250 copies / mL. A negative result does not preclude SARS-CoV-2 infection and should not be used as the sole basis for treatment or other patient management decisions.  A negative result may occur with improper specimen collection / handling, submission of specimen other than nasopharyngeal swab, presence of viral mutation(s) within the areas targeted by this assay, and inadequate number of viral copies (<250 copies / mL). A negative result must be combined with clinical observations, patient history, and epidemiological information.  Fact Sheet for Patients:   https://www.patel.info/  Fact Sheet for Healthcare Providers: https://hall.com/  This test is not yet approved or  cleared by the [place] FDA and has been authorized for detection and/or diagnosis of SARS-CoV-2 by FDA under an Emergency Use Authorization (EUA).  This EUA will remain in effect (meaning this test can be used) for the duration of the COVID-19 declaration under Section  564(b)(1) of the Act, 21 U.S.C. section 360bbb-3(b)(1), unless the authorization is terminated or revoked sooner.  Performed at [facility] [address] [place], [place] [address]     Coagulation Studies: Recent Labs    02/05/22 0505  LABPROT 14.1  INR 1.1    Urinalysis: Recent Labs    02/04/22 1702  COLORURINE STRAW*  LABSPEC 1.008  PHURINE 7.0  GLUCOSEU 50*  HGBUR SMALL*  BILIRUBINUR NEGATIVE  KETONESUR NEGATIVE  PROTEINUR 100*  NITRITE NEGATIVE  LEUKOCYTESUR NEGATIVE      Imaging: ECHOCARDIOGRAM COMPLETE  Result Date: 02/05/2022    ECHOCARDIOGRAM REPORT   Patient Name:   AUDREE SCHRECENGOST Date of Exam: 02/05/2022 Medical Rec #:  [id number]  Height:       69.0 in Accession #:    [id number] Weight:       209.0 lb Date of Birth:  [birth year]  BSA:  2.105 m Patient Age:    55 years   BP:           167/90 mmHg Patient Gender: F          HR:           64 bpm. Exam Location:  [facility] Procedure: 2D Echo, Cardiac Doppler and Color Doppler Indications:     R06.00 Dypnea  History:         Patient has no prior history of Echocardiogram examinations.                  Risk Factors:Dyslipidemia, Hypertension and Diabetes.  Sonographer:     Rosalia Hammers Referring Phys:  [id number] AMY N COX Diagnosing Phys: Nelva Bush MD  Sonographer Comments: Image acquisition challenging due to patient body habitus and Image acquisition challenging due to respiratory motion. IMPRESSIONS  1. Left ventricular ejection fraction, by estimation, is >75%. The left ventricle has hyperdynamic function. The left ventricle has no regional wall motion abnormalities. There is severe left ventricular hypertrophy. Left ventricular diastolic parameters are consistent with Grade I diastolic dysfunction (impaired relaxation). Elevated left atrial pressure.  2. Right ventricular systolic function is normal. The right ventricular size is normal.  3. The mitral valve is abnormal. Trivial mitral valve  regurgitation. No evidence of mitral stenosis.  4. The aortic valve has an indeterminant number of cusps. Aortic valve regurgitation is not visualized. No aortic stenosis is present.  5. Aortic dilatation noted. 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  6. The inferior vena cava is normal in size with greater than 50% respiratory variability, suggesting right atrial pressure of 3 mmHg. FINDINGS  Left Ventricle: Left ventricular ejection fraction, by estimation, is >75%. The left ventricle has hyperdynamic function. The left ventricle has no regional wall motion abnormalities. The left ventricular internal cavity size was normal in size. There is severe left ventricular hypertrophy. Left ventricular diastolic parameters are consistent with Grade I diastolic dysfunction (impaired relaxation). Elevated left atrial pressure. Right Ventricle: The right ventricular size is normal. No increase in right ventricular wall thickness. Right ventricular systolic function is normal. Left Atrium: Left atrial size was normal in size. Right Atrium: Right atrial size was normal in size. Pericardium: There is no evidence of pericardial effusion. Mitral Valve: The mitral valve is abnormal. There is mild thickening of the posterior mitral valve leaflet(s). There is mild calcification of the posterior mitral valve leaflet(s). Mild mitral annular calcification. Trivial mitral valve regurgitation. No  evidence of mitral valve stenosis. Tricuspid Valve: The tricuspid valve is normal in structure. Tricuspid valve regurgitation is not demonstrated. Aortic Valve: The aortic valve has an indeterminant number of cusps. Aortic valve regurgitation is not visualized. No aortic stenosis is present. Aortic valve mean gradient measures 6.0 mmHg. Aortic valve peak gradient measures 9.5 mmHg. Aortic valve area, by VTI measures 2.59 cm. Pulmonic Valve: The pulmonic valve was grossly normal. Pulmonic valve  regurgitation is not visualized. No evidence of pulmonic stenosis. Aorta: Aortic dilatation noted. 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 Pulmonary Artery: The pulmonary artery is of normal size. Venous: The inferior vena cava is normal in size with greater than 50% respiratory variability, suggesting right atrial pressure of 3 mmHg. IAS/Shunts: No atrial level shunt detected by color flow Doppler.  LEFT VENTRICLE PLAX 2D LVIDd:         3.84 cm  Diastology LVIDs:         2.08 cm   LV e' medial:    5.77 cm/s LV PW:         1.99 cm   LV E/e' medial:  18.7 LV IVS:        2.07 cm   LV e' lateral:   5.66 cm/s LVOT diam:     2.00 cm   LV E/e' lateral: 19.1 LV SV:         90 LV SV Index:   43 LVOT Area:     3.14 cm  RIGHT VENTRICLE RV Basal diam:  2.89 cm RV S prime:     14.90 cm/s TAPSE (M-mode): 2.3 cm LEFT ATRIUM             Index        RIGHT ATRIUM           Index LA diam:        3.60 cm 1.71 cm/m   RA Area:     15.30 cm LA Vol (A2C):   66.9 ml 31.78 ml/m  RA Volume:   37.50 ml  17.81 ml/m LA Vol (A4C):   49.7 ml 23.61 ml/m LA Biplane Vol: 59.7 ml 28.36 ml/m  AORTIC VALVE AV Area (Vmax):    2.73 cm AV Area (Vmean):   2.64 cm AV Area (VTI):     2.59 cm AV Vmax:           154.00 cm/s AV Vmean:          110.000 cm/s AV VTI:            0.348 m AV Peak Grad:      9.5 mmHg AV Mean Grad:      6.0 mmHg LVOT Vmax:         134.00 cm/s LVOT Vmean:        92.600 cm/s LVOT VTI:          0.287 m LVOT/AV VTI ratio: 0.82  AORTA Ao Root diam: 4.20 cm MITRAL VALVE MV Area (PHT): 2.33 cm     SHUNTS MV Decel Time: 325 msec     Systemic VTI:  0.29 m MV E velocity: 108.00 cm/s  Systemic Diam: 2.00 cm MV A velocity: 149.00 cm/s MV E/A ratio:  0.72 Harrell Gave End MD Electronically signed by Nelva Bush MD Signature Date/Time: 02/05/2022/3:28:04 PM    Final    DG Chest 2 View  Result Date: 02/04/2022 CLINICAL DATA:  Short of breath EXAM: CHEST - 2 VIEW COMPARISON:   03/02/2021 FINDINGS: Patchy opacities bilaterally, right greater than left. Trace pleural effusions. No pneumothorax. Stable mild cardiomegaly. IMPRESSION: Cardiomegaly, trace pleural effusions, and patchy opacities bilaterally reflecting edema or less likely pneumonia. Electronically Signed   By: Macy Mis M.D.   On: 02/04/2022 13:10     Medications:    furosemide (LASIX) 200 mg in dextrose 5 % 100 mL (2 mg/mL) infusion 6 mg/hr (02/05/22 1449)    amLODipine  10 mg Oral Daily   calcitRIOL  0.25 mcg Oral Daily   carvedilol  80 mg Oral Daily   heparin  5,000 Units Subcutaneous Q8H   hydrALAZINE  100 mg Oral TID   insulin aspart  0-5 Units Subcutaneous QHS   insulin aspart  0-9 Units Subcutaneous TID WC   insulin glargine-yfgn  28 Units Subcutaneous Daily   acetaminophen **OR** acetaminophen, ondansetron **OR** ondansetron (ZOFRAN) IV  Assessment/ Plan:  Dorothy Walton is a 71 y.o.  female with past medical history of diabetes, hypertension, and chronic kidney disease stage IV. Patient presents to the ED with shortness of breath from outpatient clinic. She has been admitted for Severe sepsis with acute organ dysfunction (HCC) [A41.9, R65.20] Acute decompensated heart failure ([place]) [I50.9]   Fluid volume overload with chronic kidney disease stage IV.  Currently prescribed torsemide 10 mg daily.  Requiring supplemental oxygen.  Will be placed on furosemide drip 6 mg/h to manage fluid.  We will continue to monitor creatinine.  No acute need for dialysis at this time however monitoring closely.  2.  Acute respiratory distress with diastolic heart failure.  EF greater than 75% with a severe LVH and grade 1 diastolic dysfunction.  3.  Hypertension with chronic kidney disease.  Home regimen includes amlodipine, carvedilol, furosemide and hydralazine.  Currently receiving furosemide drip.  All other medications currently prescribed.  4. Anemia of chronic kidney disease Lab Results  Component  Value Date   HGB 8.9 (L) 02/05/2022    Hemoglobin below desired target.  We will continue to monitor.  5. Secondary Hyperparathyroidism:   Lab Results  Component Value Date   CALCIUM 8.5 (L) 02/05/2022   PHOS 3.2 02/04/2022    Calcium and phosphorus remain within acceptable range.  We will monitor during this admission.   LOS: 1 Ormand Senn 7/18/20234:18 PM

## 2022-02-05 NOTE — Progress Notes (Signed)
Admission profile updated. ?

## 2022-02-05 NOTE — Progress Notes (Signed)
PROGRESS NOTE    Dorothy Walton  ZOX:[id number] DOB: 05/21/1951 DOA: 02/04/2022 PCP: [facility]    Brief Narrative:  71 year old female with history of CKD stage IV, insulin-dependent diabetes mellitus, hypertension, who presents emergency department for chief concerns of hypoxia at outpatient clinic.   Per ED report, patient's saturation was in the high 80s at outpatient clinic prompting outpatient clinic to send patient to the emergency department for further evaluation.  Initially there was concern for infectious process however lack of fever, leukocytosis, negative procalcitonin speak away from this.  Presentation is likely more consistent with new onset decompensated heart failure.   Assessment & Plan:   Principal Problem:   Severe sepsis with acute organ dysfunction (HCC) Active Problems:   HLD (hyperlipidemia)   Diabetes mellitus, type 2 (HCC)   Anemia in chronic kidney disease   Benign hypertensive kidney disease with chronic kidney disease   NSTEMI (non-ST elevated myocardial infarction) (HCC)   Chronic kidney disease, stage 4 (severe) (HCC)   Hypertensive emergency   Acute respiratory failure with hypoxia ([place])   Acute decompensated heart failure (HCC)  * Severe sepsis with acute organ dysfunction ([place]), ruled out No clear evidence of infectious process.  No leukocytosis, fever, negative procalcitonin Discontinue antibiotics Check COVID PCR Monitor vitals and fever curve   Acute respiratory failure with hypoxia (HCC) Suspect new onset heart failure Per EMS report saturation was mid to high 80s on room air.  No baseline oxygen requirement Suspect new onset heart failure Plan: Lasix GTT, started 6 mg/h Target net -1-1.5 L daily 2D echocardiogram Daily weights Strict I's and O's Consider cardiology consultation once echo results are known  CKD stage IV Patient known to outpatient nephrology service Case was discussed with Dr. Zollie Scale Plan: Lasix GTT as  above Daily renal function Nephrology consult    Hypertensive emergency Presumed secondary to decompensated heart failure Also medication nonadherence Plan: Resume PTA amlodipine 10 mg daily, Coreg CR 80 mg daily, hydralazine 100 mg 3 times daily   NSTEMI (non-ST elevated myocardial infarction) ([place]), ruled out Suspect supply demand ischemia No indication for heparin GTT Continue telemetry monitoring  Diabetes mellitus, type 2 ([place]) - Insulin-dependent diabetes mellitus - Resumed home Tresiba/long-acting insulin, 36 units daily - Insulin SSI with at bedtime coverage ordered - Goal inpatient blood glucose levels 140-180   DVT prophylaxis: SQ heparin Code Status: Full Family Communication: None today Disposition Plan: Status is: Inpatient Remains inpatient appropriate because: Acute respiratory distress, suspected new onset heart failure on IV diuresis.   Level of care: Telemetry Cardiac  Consultants:  Nephrology  Procedures:  None  Antimicrobials: None   Subjective: Seen and examined.  Resting in bed.  No visible distress.  No pain complaints.  Reports symptomatic improvement since admission.  Objective: Vitals:   02/05/22 0300 02/05/22 0504 02/05/22 0600 02/05/22 0941  BP: 122/77  (!) 159/77 (!) 167/90  Pulse: 64  64 73  Resp: 15  15 18   Temp:  98.2 F (36.8 C)  97.8 F (36.6 C)  TempSrc:  Oral  Oral  SpO2: 96%  97% 99%  Weight:      Height:        Intake/Output Summary (Last 24 hours) at 02/05/2022 1329 Last data filed at 02/04/2022 2338 Gross per 24 hour  Intake 100 ml  Output 2350 ml  Net -2250 ml   Filed Weights   02/04/22 1225  Weight: 94.8 kg    Examination:  General exam: Appears calm and comfortable  Respiratory system: Bibasilar crackles.  Normal work of breathing.  2 L Cardiovascular system: S1-S2, RRR, no murmurs, no pedal edema Gastrointestinal system: Soft, NT/ND, normal bowel sounds Central nervous system: Alert and oriented.  No focal neurological deficits. Extremities: Symmetric 5 x 5 power. Skin: No rashes, lesions or ulcers Psychiatry: Judgement and insight appear normal. Mood & affect appropriate.     Data Reviewed: I have personally reviewed following labs and imaging studies  CBC: Recent Labs  Lab 02/04/22 1206 02/05/22 0505  WBC 13.1* 9.8  HGB 10.0* 8.9*  HCT 34.0* 30.1*  MCV 85.2 85.0  PLT 266 161   Basic Metabolic Panel: Recent Labs  Lab 02/04/22 1206 02/04/22 1659 02/05/22 0505  NA 141  --  140  K 5.1  --  4.5  CL 112*  --  112*  CO2 19*  --  22  GLUCOSE 117*  --  62*  BUN 39*  --  40*  CREATININE 3.07*  --  3.02*  CALCIUM 8.9  --  8.5*  MG  --  2.2  --   PHOS  --  3.2  --    GFR: Estimated Creatinine Clearance: 21.2 mL/min (A) (by C-G formula based on SCr of 3.02 mg/dL (H)). Liver Function Tests: No results for input(s): "AST", "ALT", "ALKPHOS", "BILITOT", "PROT", "ALBUMIN" in the last 168 hours. No results for input(s): "LIPASE", "AMYLASE" in the last 168 hours. No results for input(s): "AMMONIA" in the last 168 hours. Coagulation Profile: Recent Labs  Lab 02/05/22 0505  INR 1.1   Cardiac Enzymes: No results for input(s): "CKTOTAL", "CKMB", "CKMBINDEX", "TROPONINI" in the last 168 hours. BNP (last 3 results) No results for input(s): "PROBNP" in the last 8760 hours. HbA1C: Recent Labs    02/05/22 0505  HGBA1C 6.7*   CBG: Recent Labs  Lab 02/04/22 2231 02/05/22 0908 02/05/22 1153  GLUCAP 93 163* 148*   Lipid Profile: No results for input(s): "CHOL", "HDL", "LDLCALC", "TRIG", "CHOLHDL", "LDLDIRECT" in the last 72 hours. Thyroid Function Tests: No results for input(s): "TSH", "T4TOTAL", "FREET4", "T3FREE", "THYROIDAB" in the last 72 hours. Anemia Panel: No results for input(s): "VITAMINB12", "FOLATE", "FERRITIN", "TIBC", "IRON", "RETICCTPCT" in the last 72 hours. Sepsis Labs: Recent Labs  Lab 02/04/22 1659 02/04/22 1702  PROCALCITON <0.10  --    LATICACIDVEN  --  1.0    Recent Results (from the past 240 hour(s))  Culture, blood (Routine X 2) w Reflex to ID Panel     Status: None (Preliminary result)   Collection Time: 02/04/22  5:15 PM   Specimen: BLOOD  Result Value Ref Range Status   Specimen Description BLOOD BLOOD LEFT HAND  Final   Special Requests AEROBIC BOTTLE ONLY Blood Culture adequate volume  Final   Culture   Final    NO GROWTH < 24 HOURS Performed at [facility], [address] [place], [place] [address]    Report Status PENDING  Incomplete  Culture, blood (Routine X 2) w Reflex to ID Panel     Status: None (Preliminary result)   Collection Time: 02/04/22  5:22 PM   Specimen: BLOOD  Result Value Ref Range Status   Specimen Description BLOOD LEFT ANTECUBITAL  Final   Special Requests   Final    BOTTLES DRAWN AEROBIC AND ANAEROBIC Blood Culture results may not be optimal due to an excessive volume of blood received in culture bottles   Culture   Final    NO GROWTH < 24 HOURS Performed at [facility]  [facility], [address] [place], [place] [address]    Report Status PENDING  Incomplete         Radiology Studies: DG Chest 2 View  Result Date: 02/04/2022 CLINICAL DATA:  Short of breath EXAM: CHEST - 2 VIEW COMPARISON:  03/02/2021 FINDINGS: Patchy opacities bilaterally, right greater than left. Trace pleural effusions. No pneumothorax. Stable mild cardiomegaly. IMPRESSION: Cardiomegaly, trace pleural effusions, and patchy opacities bilaterally reflecting edema or less likely pneumonia. Electronically Signed   By: Macy Mis M.D.   On: 02/04/2022 13:10        Scheduled Meds:  amLODipine  10 mg Oral Daily   calcitRIOL  0.25 mcg Oral Daily   carvedilol  80 mg Oral Daily   heparin  5,000 Units Subcutaneous Q8H   hydrALAZINE  100 mg Oral TID   insulin aspart  0-5 Units Subcutaneous QHS   insulin aspart  0-9 Units Subcutaneous TID WC   insulin glargine-yfgn  28 Units Subcutaneous  Daily   Continuous Infusions:  furosemide (LASIX) 200 mg in dextrose 5 % 100 mL (2 mg/mL) infusion       LOS: 1 day     Sidney Ace, MD [facility]   If 7PM-7AM, please contact night-coverage  02/05/2022, 1:29 PM

## 2022-02-06 ENCOUNTER — Encounter: Payer: Self-pay | Admitting: Internal Medicine

## 2022-02-06 DIAGNOSIS — I5033 Acute on chronic diastolic (congestive) heart failure: Secondary | ICD-10-CM

## 2022-02-06 DIAGNOSIS — N184 Chronic kidney disease, stage 4 (severe): Secondary | ICD-10-CM | POA: Diagnosis not present

## 2022-02-06 DIAGNOSIS — I161 Hypertensive emergency: Secondary | ICD-10-CM | POA: Diagnosis not present

## 2022-02-06 DIAGNOSIS — N179 Acute kidney failure, unspecified: Secondary | ICD-10-CM

## 2022-02-06 DIAGNOSIS — J9601 Acute respiratory failure with hypoxia: Secondary | ICD-10-CM

## 2022-02-06 LAB — GLUCOSE, CAPILLARY
Glucose-Capillary: 73 mg/dL (ref 70–99)
Glucose-Capillary: 91 mg/dL (ref 70–99)
Glucose-Capillary: 108 mg/dL — ABNORMAL HIGH (ref 70–99)
Glucose-Capillary: 90 mg/dL (ref 70–99)

## 2022-02-06 LAB — BASIC METABOLIC PANEL
Anion gap: 8 (ref 5–15)
BUN: 57 mg/dL — ABNORMAL HIGH (ref 8–23)
CO2: 22 mmol/L (ref 22–32)
Calcium: 8.4 mg/dL — ABNORMAL LOW (ref 8.9–10.3)
Chloride: 109 mmol/L (ref 98–111)
Creatinine, Ser: 3.83 mg/dL — ABNORMAL HIGH (ref 0.44–1.00)
GFR, Estimated: 12 mL/min — ABNORMAL LOW (ref >60)
Glucose, Bld: 87 mg/dL (ref 70–99)
Potassium: 4.3 mmol/L (ref 3.5–5.1)
Sodium: 139 mmol/L (ref 135–145)

## 2022-02-06 NOTE — TOC CM/SW Note (Signed)
  Transition of Care [facility]) Screening Note   Patient Details  Name: Cathaleen Korol Date of Birth: Aug 05, 1950   Transition of Care [facility] CM/SW Contact:    Candie Chroman, LCSW Phone Number: 02/06/2022, 11:20 AM    Transition of Care Department [facility]) has reviewed patient and no TOC needs have been identified at this time. We will continue to monitor patient advancement through interdisciplinary progression rounds. If new patient transition needs arise, please place a TOC consult.

## 2022-02-06 NOTE — Progress Notes (Addendum)
Progress Note    Dorothy Walton  LZJ:[id number] DOB: 1951-07-05  DOA: 02/04/2022 PCP: [facility]      Brief Narrative:    Medical records reviewed and are as summarized below:   [name] is a 71 year old female with history of CKD stage IV, insulin-dependent diabetes mellitus, hypertension, who presents emergency department for chief concerns of hypoxia at outpatient clinic.  Per ED report, patient's saturation was in the high 80s at outpatient clinic prompting outpatient clinic to send patient to the emergency department for further evaluation.  Initial vitals in the emergency department showed temperature of 98.1, respiration rate of 18, heart rate of 81, blood pressure 211/99, SPO2 of 90% on room air.  Serum sodium is 1 41, potassium 5.1, chloride of 112, bicarb 19, BUN of 39, serum creatinine of 3.07, GFR 16, nonfasting blood glucose 117, WBC 13.1, platelet platelets of 266, hemoglobin 10.  High sensitive troponin is 22 and on repeat was also 22  ED treatment: Lasix 60 mg IV one-time dose, labetalol 20 mg IV one-time dose      Assessment/Plan:   Principal Problem:   Acute on chronic diastolic CHF (congestive heart failure) (HCC) Active Problems:   Hypertensive emergency   Acute respiratory failure with hypoxia (HCC)   AKI (acute kidney injury) ([place])   Chronic kidney disease, stage 4 (severe) (HCC)   HLD (hyperlipidemia)   Diabetes mellitus, type 2 (HCC)   Anemia in chronic kidney disease   Benign hypertensive kidney disease with chronic kidney disease   Body mass index is 30.63 kg/m.  (Obesity)    Acute on chronic diastolic CHF: Discontinue IV Lasix drip because of increasing creatinine above baseline.  Monitor BMP, daily weight and urine output.  AKI on CKD stage IV: Discontinue IV Lasix.  Monitor BMP.  Follow-up with nephrologist.  Acute hypoxic respiratory failure: Resolved  Hypertensive emergency: BP has improved.  Continue  antihypertensives.  Type II DM: Continue insulin glargine and NovoLog.   Diet Order             Diet heart healthy/carb modified Room service appropriate? Yes; Fluid consistency: Thin  Diet effective now                          Consultants: Nephrologist  Procedures: None    Medications:    amLODipine  10 mg Oral Daily   calcitRIOL  0.25 mcg Oral Daily   carvedilol  80 mg Oral Daily   heparin  5,000 Units Subcutaneous Q8H   hydrALAZINE  100 mg Oral TID   insulin aspart  0-5 Units Subcutaneous QHS   insulin aspart  0-9 Units Subcutaneous TID WC   insulin glargine-yfgn  28 Units Subcutaneous Daily   Continuous Infusions:   Anti-infectives (From admission, onward)    Start     Dose/Rate Route Frequency Ordered Stop   02/04/22 1700  cefTRIAXone (ROCEPHIN) 2 g in sodium chloride 0.9 % 100 mL IVPB  Status:  Discontinued        2 g 200 mL/hr over 30 Minutes Intravenous Every 24 hours 02/04/22 1648 02/05/22 0758   02/04/22 1700  azithromycin (ZITHROMAX) 500 mg in sodium chloride 0.9 % 250 mL IVPB  Status:  Discontinued        500 mg 250 mL/hr over 60 Minutes Intravenous Every 24 hours 02/04/22 1648 02/05/22 0758              Family Communication/Anticipated  D/C date and plan/Code Status   DVT prophylaxis: heparin injection 5,000 Units Start: 02/04/22 1700 Place TED hose Start: 02/04/22 1647     Code Status: Full Code  Family Communication: None Disposition Plan: Plan to discharge home in 1 to 2 days   Status is: Inpatient Remains inpatient appropriate because: AKI from IV Lasix       Subjective:   Interval events noted.  No shortness of breath or chest pain.  Leg swelling has improved.  Objective:    Vitals:   02/05/22 2353 02/06/22 0411 02/06/22 0724 02/06/22 1218  BP: 129/69 120/81 (!) 141/80 107/75  Pulse: 73 73 68 66  Resp: 20 18 18 18   Temp: 98.3 F (36.8 C) 98.2 F (36.8 C) 98.4 F (36.9 C) 98.2 F (36.8 C)  TempSrc:       SpO2: 92% 97% 92% 97%  Weight:      Height:       No data found.   Intake/Output Summary (Last 24 hours) at 02/06/2022 1413 Last data filed at 02/06/2022 1300 Gross per 24 hour  Intake 487.18 ml  Output 2725 ml  Net -2237.82 ml   Filed Weights   02/04/22 1225 02/05/22 1706  Weight: 94.8 kg 94.1 kg    Exam:  GEN: NAD SKIN: No rash EYES: EOMI ENT: MMM CV: RRR PULM: CTA B ABD: soft, obese, NT, +BS CNS: AAO x 3, non focal EXT: No edema or tenderness        Data Reviewed:   I have personally reviewed following labs and imaging studies:  Labs: Labs show the following:   Basic Metabolic Panel: Recent Labs  Lab 02/04/22 1206 02/04/22 1659 02/05/22 0505 02/06/22 0402  NA 141  --  140 139  K 5.1  --  4.5 4.3  CL 112*  --  112* 109  CO2 19*  --  22 22  GLUCOSE 117*  --  62* 87  BUN 39*  --  40* 57*  CREATININE 3.07*  --  3.02* 3.83*  CALCIUM 8.9  --  8.5* 8.4*  MG  --  2.2  --   --   PHOS  --  3.2  --   --    GFR Estimated Creatinine Clearance: 16.7 mL/min (A) (by C-G formula based on SCr of 3.83 mg/dL (H)). Liver Function Tests: No results for input(s): "AST", "ALT", "ALKPHOS", "BILITOT", "PROT", "ALBUMIN" in the last 168 hours. No results for input(s): "LIPASE", "AMYLASE" in the last 168 hours. No results for input(s): "AMMONIA" in the last 168 hours. Coagulation profile Recent Labs  Lab 02/05/22 0505  INR 1.1    CBC: Recent Labs  Lab 02/04/22 1206 02/05/22 0505  WBC 13.1* 9.8  HGB 10.0* 8.9*  HCT 34.0* 30.1*  MCV 85.2 85.0  PLT 266 256   Cardiac Enzymes: No results for input(s): "CKTOTAL", "CKMB", "CKMBINDEX", "TROPONINI" in the last 168 hours. BNP (last 3 results) No results for input(s): "PROBNP" in the last 8760 hours. CBG: Recent Labs  Lab 02/05/22 1153 02/05/22 1740 02/05/22 2036 02/06/22 0719 02/06/22 1217  GLUCAP 148* 110* 141* 73 91   D-Dimer: No results for input(s): "DDIMER" in the last 72 hours. Hgb  A1c: Recent Labs    02/05/22 0505  HGBA1C 6.7*   Lipid Profile: No results for input(s): "CHOL", "HDL", "LDLCALC", "TRIG", "CHOLHDL", "LDLDIRECT" in the last 72 hours. Thyroid function studies: No results for input(s): "TSH", "T4TOTAL", "T3FREE", "THYROIDAB" in the last 72 hours.  Invalid input(s): "FREET3" Anemia  work up: No results for input(s): "VITAMINB12", "FOLATE", "FERRITIN", "TIBC", "IRON", "RETICCTPCT" in the last 72 hours. Sepsis Labs: Recent Labs  Lab 02/04/22 1206 02/04/22 1659 02/04/22 1702 02/05/22 0505  PROCALCITON  --  <0.10  --   --   WBC 13.1*  --   --  9.8  LATICACIDVEN  --   --  1.0  --     Microbiology Recent Results (from the past 240 hour(s))  Urine Culture     Status: None   Collection Time: 02/04/22  5:02 PM   Specimen: Urine, Clean Catch  Result Value Ref Range Status   Specimen Description   Final    URINE, CLEAN CATCH Performed at [facility], [address] [place], [place] [address]    Special Requests   Final    NONE Performed at [facility], [address], [place], [place] [address]    Culture   Final    NO GROWTH Performed at [facility], [place] [address], [place], [place] [address]    Report Status 02/05/2022 FINAL  Final  Culture, blood (Routine X 2) w Reflex to ID Panel     Status: None (Preliminary result)   Collection Time: 02/04/22  5:15 PM   Specimen: BLOOD  Result Value Ref Range Status   Specimen Description BLOOD BLOOD LEFT HAND  Final   Special Requests AEROBIC BOTTLE ONLY Blood Culture adequate volume  Final   Culture   Final    NO GROWTH 2 DAYS Performed at [facility], [address], [place],  [address]    Report Status PENDING  Incomplete  Culture, blood (Routine X 2) w Reflex to ID Panel     Status: None (Preliminary result)   Collection Time: 02/04/22  5:22 PM   Specimen: BLOOD  Result Value Ref Range Status   Specimen Description BLOOD LEFT ANTECUBITAL  Final    Special Requests   Final    BOTTLES DRAWN AEROBIC AND ANAEROBIC Blood Culture results may not be optimal due to an excessive volume of blood received in culture bottles   Culture   Final    NO GROWTH 2 DAYS Performed at [facility], [address] [place],  [address]    Report Status PENDING  Incomplete  SARS Coronavirus 2 by RT PCR (hospital order, performed in [place] hospital lab) *cepheid single result test* Anterior Nasal Swab     Status: None   Collection Time: 02/05/22  3:13 PM   Specimen: Anterior Nasal Swab  Result Value Ref Range Status   SARS Coronavirus 2 by RT PCR NEGATIVE NEGATIVE Final    Comment: (NOTE) SARS-CoV-2 target nucleic acids are NOT DETECTED.  The SARS-CoV-2 RNA is generally detectable in upper and lower respiratory specimens during the acute phase of infection. The lowest concentration of SARS-CoV-2 viral copies this assay can detect is 250 copies / mL. A negative result does not preclude SARS-CoV-2 infection and should not be used as the sole basis for treatment or other patient management decisions.  A negative result may occur with improper specimen collection / handling, submission of specimen other than nasopharyngeal swab, presence of viral mutation(s) within the areas targeted by this assay, and inadequate number of viral copies (<250 copies / mL). A negative result must be combined with clinical observations, patient history, and epidemiological information.  Fact Sheet for Patients:   https://www.patel.info/  Fact Sheet for Healthcare Providers: https://hall.com/  This test is not yet approved  or  cleared by the [place] and has been authorized for detection and/or diagnosis of SARS-CoV-2 by FDA under an Emergency Use Authorization (EUA).  This EUA will remain in effect (meaning this test can be used) for the duration of the COVID-19 declaration under Section 564(b)(1)  of the Act, 21 U.S.C. section 360bbb-3(b)(1), unless the authorization is terminated or revoked sooner.  Performed at [facility], [address] [place], [place] [address]     Procedures and diagnostic studies:  ECHOCARDIOGRAM COMPLETE  Result Date: 02/05/2022    ECHOCARDIOGRAM REPORT   Patient Name:   Dorothy Walton Date of Exam: 02/05/2022 Medical Rec #:  [id number]  Height:       69.0 in Accession #:    [id number] Weight:       209.0 lb Date of Birth:  17-May-1951  BSA:          2.105 m Patient Age:    71 years   BP:           167/90 mmHg Patient Gender: F          HR:           64 bpm. Exam Location:  [facility] Procedure: 2D Echo, Cardiac Doppler and Color Doppler Indications:     R06.00 Dypnea  History:         Patient has no prior history of Echocardiogram examinations.                  Risk Factors:Dyslipidemia, Hypertension and Diabetes.  Sonographer:     Rosalia Hammers Referring Phys:  [id number] AMY N COX Diagnosing Phys: Nelva Bush MD  Sonographer Comments: Image acquisition challenging due to patient body habitus and Image acquisition challenging due to respiratory motion. IMPRESSIONS  1. Left ventricular ejection fraction, by estimation, is >75%. The left ventricle has hyperdynamic function. The left ventricle has no regional wall motion abnormalities. There is severe left ventricular hypertrophy. Left ventricular diastolic parameters are consistent with Grade I diastolic dysfunction (impaired relaxation). Elevated left atrial pressure.  2. Right ventricular systolic function is normal. The right ventricular size is normal.  3. The mitral valve is abnormal. Trivial mitral valve regurgitation. No evidence of mitral stenosis.  4. The aortic valve has an indeterminant number of cusps. Aortic valve regurgitation is not visualized. No aortic stenosis is present.  5. Aortic dilatation noted. 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  6. The  inferior vena cava is normal in size with greater than 50% respiratory variability, suggesting right atrial pressure of 3 mmHg. FINDINGS  Left Ventricle: Left ventricular ejection fraction, by estimation, is >75%. The left ventricle has hyperdynamic function. The left ventricle has no regional wall motion abnormalities. The left ventricular internal cavity size was normal in size. There is severe left ventricular hypertrophy. Left ventricular diastolic parameters are consistent with Grade I diastolic dysfunction (impaired relaxation). Elevated left atrial pressure. Right Ventricle: The right ventricular size is normal. No increase in right ventricular wall thickness. Right ventricular systolic function is normal. Left Atrium: Left atrial size was normal in size. Right Atrium: Right atrial size was normal in size. Pericardium: There is no evidence of pericardial effusion. Mitral Valve: The mitral valve is abnormal. There is mild thickening of the posterior mitral valve leaflet(s). There is mild calcification of the posterior mitral valve leaflet(s). Mild mitral annular calcification. Trivial mitral valve regurgitation. No  evidence of mitral valve stenosis. Tricuspid Valve:  The tricuspid valve is normal in structure. Tricuspid valve regurgitation is not demonstrated. Aortic Valve: The aortic valve has an indeterminant number of cusps. Aortic valve regurgitation is not visualized. No aortic stenosis is present. Aortic valve mean gradient measures 6.0 mmHg. Aortic valve peak gradient measures 9.5 mmHg. Aortic valve area, by VTI measures 2.59 cm. Pulmonic Valve: The pulmonic valve was grossly normal. Pulmonic valve regurgitation is not visualized. No evidence of pulmonic stenosis. Aorta: Aortic dilatation noted. 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 Pulmonary Artery: The pulmonary artery is of normal size. Venous: The inferior vena cava is normal in size with greater  than 50% respiratory variability, suggesting right atrial pressure of 3 mmHg. IAS/Shunts: No atrial level shunt detected by color flow Doppler.  LEFT VENTRICLE PLAX 2D LVIDd:         3.84 cm   Diastology LVIDs:         2.08 cm   LV e' medial:    5.77 cm/s LV PW:         1.99 cm   LV E/e' medial:  18.7 LV IVS:        2.07 cm   LV e' lateral:   5.66 cm/s LVOT diam:     2.00 cm   LV E/e' lateral: 19.1 LV SV:         90 LV SV Index:   43 LVOT Area:     3.14 cm  RIGHT VENTRICLE RV Basal diam:  2.89 cm RV S prime:     14.90 cm/s TAPSE (M-mode): 2.3 cm LEFT ATRIUM             Index        RIGHT ATRIUM           Index LA diam:        3.60 cm 1.71 cm/m   RA Area:     15.30 cm LA Vol (A2C):   66.9 ml 31.78 ml/m  RA Volume:   37.50 ml  17.81 ml/m LA Vol (A4C):   49.7 ml 23.61 ml/m LA Biplane Vol: 59.7 ml 28.36 ml/m  AORTIC VALVE AV Area (Vmax):    2.73 cm AV Area (Vmean):   2.64 cm AV Area (VTI):     2.59 cm AV Vmax:           154.00 cm/s AV Vmean:          110.000 cm/s AV VTI:            0.348 m AV Peak Grad:      9.5 mmHg AV Mean Grad:      6.0 mmHg LVOT Vmax:         134.00 cm/s LVOT Vmean:        92.600 cm/s LVOT VTI:          0.287 m LVOT/AV VTI ratio: 0.82  AORTA Ao Root diam: 4.20 cm MITRAL VALVE MV Area (PHT): 2.33 cm     SHUNTS MV Decel Time: 325 msec     Systemic VTI:  0.29 m MV E velocity: 108.00 cm/s  Systemic Diam: 2.00 cm MV A velocity: 149.00 cm/s MV E/A ratio:  0.72 Harrell Gave End MD Electronically signed by Nelva Bush MD Signature Date/Time: 02/05/2022/3:28:04 PM    Final                LOS: 2 days   Dorothy Walton  [facility]   Pager on [email or web address].  If 7PM-7AM, please contact night-coverage at [email or web address]     02/06/2022, 2:13 PM

## 2022-02-06 NOTE — Progress Notes (Signed)
[facility]  ROUNDING NOTE   Subjective:   Dorothy Walton is a 71 y.o female with past medical history of diabetes, hypertension, and chronic kidney disease stage IV. Patient presents to the ED with shortness of breath from outpatient clinic. She has been admitted for Severe sepsis with acute organ dysfunction ([place]) [A41.9, R65.20] Acute decompensated heart failure ([place]) [I50.9]  Patient is known to our practice and is followed by  Dr Holley Raring.   Patient seen sitting up in bed, alert and oriented States she feels better today Currently on room air Lung sounds improved however crackles remain on right  No lower extremity edema  Creatinine 3.8 Urine output 2.2 L in preceding 24 hours   Objective:  Vital signs in last 24 hours:  Temp:  [98.1 F (36.7 C)-98.4 F (36.9 C)] 98.2 F (36.8 C) (07/19 1218) Pulse Rate:  [66-84] 66 (07/19 1218) Resp:  [18-20] 18 (07/19 1218) BP: (107-148)/(58-81) 107/75 (07/19 1218) SpO2:  [92 %-97 %] 97 % (07/19 1218) Weight:  [94.1 kg] 94.1 kg (07/18 1706)  Weight change: -0.726 kg Filed Weights   02/04/22 1225 02/05/22 1706  Weight: 94.8 kg 94.1 kg    Intake/Output: I/O last 3 completed shifts: In: 7.2 [I.V.:7.2] Out: 4550 [Urine:4550]   Intake/Output this shift:  Total I/O In: 240 [P.O.:240] Out: 525 [Urine:525]  Physical Exam: General: NAD, resting comfortably  Head: Normocephalic, atraumatic. Moist oral mucosal membranes  Eyes: Anicteric  Lungs:  Crackles greater on the right side than left, normal effort  Heart: Regular rate and rhythm  Abdomen:  Soft, nontender  Extremities: No peripheral edema.  Neurologic: Nonfocal, moving all four extremities  Skin: No lesions  Access: None    Basic Metabolic Panel: Recent Labs  Lab 02/04/22 1206 02/04/22 1659 02/05/22 0505 02/06/22 0402  NA 141  --  140 139  K 5.1  --  4.5 4.3  CL 112*  --  112* 109  CO2 19*  --  22 22  GLUCOSE 117*  --  62* 87  BUN 39*  --  40* 57*   CREATININE 3.07*  --  3.02* 3.83*  CALCIUM 8.9  --  8.5* 8.4*  MG  --  2.2  --   --   PHOS  --  3.2  --   --      Liver Function Tests: No results for input(s): "AST", "ALT", "ALKPHOS", "BILITOT", "PROT", "ALBUMIN" in the last 168 hours. No results for input(s): "LIPASE", "AMYLASE" in the last 168 hours. No results for input(s): "AMMONIA" in the last 168 hours.  CBC: Recent Labs  Lab 02/04/22 1206 02/05/22 0505  WBC 13.1* 9.8  HGB 10.0* 8.9*  HCT 34.0* 30.1*  MCV 85.2 85.0  PLT 266 256     Cardiac Enzymes: No results for input(s): "CKTOTAL", "CKMB", "CKMBINDEX", "TROPONINI" in the last 168 hours.  BNP: Invalid input(s): "POCBNP"  CBG: Recent Labs  Lab 02/05/22 1153 02/05/22 1740 02/05/22 2036 02/06/22 0719 02/06/22 1217  GLUCAP 148* 110* 141* 73 91     Microbiology: Results for orders placed or performed during the hospital encounter of 02/04/22  Urine Culture     Status: None   Collection Time: 02/04/22  5:02 PM   Specimen: Urine, Clean Catch  Result Value Ref Range Status   Specimen Description   Final    URINE, CLEAN CATCH Performed at [facility], [address] [place], [place] [address]    Special Requests   Final  NONE Performed at [facility], [address], [place], [place] [address]    Culture   Final    NO GROWTH Performed at [facility], [place] [address] [place], [place] [address]    Report Status 02/05/2022 FINAL  Final  Culture, blood (Routine X 2) w Reflex to ID Panel     Status: None (Preliminary result)   Collection Time: 02/04/22  5:15 PM   Specimen: BLOOD  Result Value Ref Range Status   Specimen Description BLOOD BLOOD LEFT HAND  Final   Special Requests AEROBIC BOTTLE ONLY Blood Culture adequate volume  Final   Culture   Final    NO GROWTH 2 DAYS Performed at [facility], [address], [place], [place] [address]    Report Status PENDING  Incomplete  Culture, blood (Routine X 2)  w Reflex to ID Panel     Status: None (Preliminary result)   Collection Time: 02/04/22  5:22 PM   Specimen: BLOOD  Result Value Ref Range Status   Specimen Description BLOOD LEFT ANTECUBITAL  Final   Special Requests   Final    BOTTLES DRAWN AEROBIC AND ANAEROBIC Blood Culture results may not be optimal due to an excessive volume of blood received in culture bottles   Culture   Final    NO GROWTH 2 DAYS Performed at [facility], [address], [place], [place] [address]    Report Status PENDING  Incomplete  SARS Coronavirus 2 by RT PCR (hospital order, performed in [place] hospital lab) *cepheid single result test* Anterior Nasal Swab     Status: None   Collection Time: 02/05/22  3:13 PM   Specimen: Anterior Nasal Swab  Result Value Ref Range Status   SARS Coronavirus 2 by RT PCR NEGATIVE NEGATIVE Final    Comment: (NOTE) SARS-CoV-2 target nucleic acids are NOT DETECTED.  The SARS-CoV-2 RNA is generally detectable in upper and lower respiratory specimens during the acute phase of infection. The lowest concentration of SARS-CoV-2 viral copies this assay can detect is 250 copies / mL. A negative result does not preclude SARS-CoV-2 infection and should not be used as the sole basis for treatment or other patient management decisions.  A negative result may occur with improper specimen collection / handling, submission of specimen other than nasopharyngeal swab, presence of viral mutation(s) within the areas targeted by this assay, and inadequate number of viral copies (<250 copies / mL). A negative result must be combined with clinical observations, patient history, and epidemiological information.  Fact Sheet for Patients:   https://www.patel.info/  Fact Sheet for Healthcare Providers: https://hall.com/  This test is not yet approved or  cleared by the [place] FDA and has been authorized for detection and/or  diagnosis of SARS-CoV-2 by FDA under an Emergency Use Authorization (EUA).  This EUA will remain in effect (meaning this test can be used) for the duration of the COVID-19 declaration under Section 564(b)(1) of the Act, 21 U.S.C. section 360bbb-3(b)(1), unless the authorization is terminated or revoked sooner.  Performed at [facility], [address] [place], [place] [address]     Coagulation Studies: Recent Labs    02/05/22 0505  LABPROT 14.1  INR 1.1     Urinalysis: Recent Labs    02/04/22 1702  COLORURINE STRAW*  LABSPEC 1.008  PHURINE 7.0  GLUCOSEU 50*  HGBUR SMALL*  BILIRUBINUR NEGATIVE  KETONESUR NEGATIVE  PROTEINUR 100*  NITRITE NEGATIVE  LEUKOCYTESUR NEGATIVE  Imaging: ECHOCARDIOGRAM COMPLETE  Result Date: 02/05/2022    ECHOCARDIOGRAM REPORT   Patient Name:   WRENLEY SAYED Date of Exam: 02/05/2022 Medical Rec #:  [id number]  Height:       69.0 in Accession #:    [id number] Weight:       209.0 lb Date of Birth:  [birth year]  BSA:          2.105 m Patient Age:    32 years   BP:           167/90 mmHg Patient Gender: F          HR:           64 bpm. Exam Location:  [facility] Procedure: 2D Echo, Cardiac Doppler and Color Doppler Indications:     R06.00 Dypnea  History:         Patient has no prior history of Echocardiogram examinations.                  Risk Factors:Dyslipidemia, Hypertension and Diabetes.  Sonographer:     Rosalia Hammers Referring Phys:  [id number] AMY N COX Diagnosing Phys: Nelva Bush MD  Sonographer Comments: Image acquisition challenging due to patient body habitus and Image acquisition challenging due to respiratory motion. IMPRESSIONS  1. Left ventricular ejection fraction, by estimation, is >75%. The left ventricle has hyperdynamic function. The left ventricle has no regional wall motion abnormalities. There is severe left ventricular hypertrophy. Left ventricular diastolic parameters are consistent with Grade I diastolic dysfunction  (impaired relaxation). Elevated left atrial pressure.  2. Right ventricular systolic function is normal. The right ventricular size is normal.  3. The mitral valve is abnormal. Trivial mitral valve regurgitation. No evidence of mitral stenosis.  4. The aortic valve has an indeterminant number of cusps. Aortic valve regurgitation is not visualized. No aortic stenosis is present.  5. Aortic dilatation noted. 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  6. The inferior vena cava is normal in size with greater than 50% respiratory variability, suggesting right atrial pressure of 3 mmHg. FINDINGS  Left Ventricle: Left ventricular ejection fraction, by estimation, is >75%. The left ventricle has hyperdynamic function. The left ventricle has no regional wall motion abnormalities. The left ventricular internal cavity size was normal in size. There is severe left ventricular hypertrophy. Left ventricular diastolic parameters are consistent with Grade I diastolic dysfunction (impaired relaxation). Elevated left atrial pressure. Right Ventricle: The right ventricular size is normal. No increase in right ventricular wall thickness. Right ventricular systolic function is normal. Left Atrium: Left atrial size was normal in size. Right Atrium: Right atrial size was normal in size. Pericardium: There is no evidence of pericardial effusion. Mitral Valve: The mitral valve is abnormal. There is mild thickening of the posterior mitral valve leaflet(s). There is mild calcification of the posterior mitral valve leaflet(s). Mild mitral annular calcification. Trivial mitral valve regurgitation. No  evidence of mitral valve stenosis. Tricuspid Valve: The tricuspid valve is normal in structure. Tricuspid valve regurgitation is not demonstrated. Aortic Valve: The aortic valve has an indeterminant number of cusps. Aortic valve regurgitation is not visualized. No aortic stenosis is present. Aortic  valve mean gradient measures 6.0 mmHg. Aortic valve peak gradient measures 9.5 mmHg. Aortic valve area, by VTI measures 2.59 cm. Pulmonic Valve: The pulmonic valve was grossly normal. Pulmonic valve regurgitation is not visualized. No evidence of pulmonic stenosis. Aorta: Aortic dilatation noted. There is mild dilatation of  the aortic root and of the ascending aorta, measuring 42 mm. Pulmonary Artery: The pulmonary artery is of normal size. Venous: The inferior vena cava is normal in size with greater than 50% respiratory variability, suggesting right atrial pressure of 3 mmHg. IAS/Shunts: No atrial level shunt detected by color flow Doppler.  LEFT VENTRICLE PLAX 2D LVIDd:         3.84 cm   Diastology LVIDs:         2.08 cm   LV e' medial:    5.77 cm/s LV PW:         1.99 cm   LV E/e' medial:  18.7 LV IVS:        2.07 cm   LV e' lateral:   5.66 cm/s LVOT diam:     2.00 cm   LV E/e' lateral: 19.1 LV SV:         90 LV SV Index:   43 LVOT Area:     3.14 cm  RIGHT VENTRICLE RV Basal diam:  2.89 cm RV S prime:     14.90 cm/s TAPSE (M-mode): 2.3 cm LEFT ATRIUM             Index        RIGHT ATRIUM           Index LA diam:        3.60 cm 1.71 cm/m   RA Area:     15.30 cm LA Vol (A2C):   66.9 ml 31.78 ml/m  RA Volume:   37.50 ml  17.81 ml/m LA Vol (A4C):   49.7 ml 23.61 ml/m LA Biplane Vol: 59.7 ml 28.36 ml/m  AORTIC VALVE AV Area (Vmax):    2.73 cm AV Area (Vmean):   2.64 cm AV Area (VTI):     2.59 cm AV Vmax:           154.00 cm/s AV Vmean:          110.000 cm/s AV VTI:            0.348 m AV Peak Grad:      9.5 mmHg AV Mean Grad:      6.0 mmHg LVOT Vmax:         134.00 cm/s LVOT Vmean:        92.600 cm/s LVOT VTI:          0.287 m LVOT/AV VTI ratio: 0.82  AORTA Ao Root diam: 4.20 cm MITRAL VALVE MV Area (PHT): 2.33 cm     SHUNTS MV Decel Time: 325 msec     Systemic VTI:  0.29 m MV E velocity: 108.00 cm/s  Systemic Diam: 2.00 cm MV A velocity: 149.00 cm/s MV E/A ratio:  0.72 Christopher End MD Electronically  signed by Nelva Bush MD Signature Date/Time: 02/05/2022/3:28:04 PM    Final      Medications:      amLODipine  10 mg Oral Daily   calcitRIOL  0.25 mcg Oral Daily   carvedilol  80 mg Oral Daily   heparin  5,000 Units Subcutaneous Q8H   hydrALAZINE  100 mg Oral TID   insulin aspart  0-5 Units Subcutaneous QHS   insulin aspart  0-9 Units Subcutaneous TID WC   insulin glargine-yfgn  28 Units Subcutaneous Daily   acetaminophen **OR** acetaminophen, ondansetron **OR** ondansetron (ZOFRAN) IV  Assessment/ Plan:  Ms. Jakera Beaupre is a 71 y.o.  female with past medical history of diabetes, hypertension, and chronic kidney disease stage IV. Patient presents  to the ED with shortness of breath from outpatient clinic. She has been admitted for Severe sepsis with acute organ dysfunction ([facility]) [A41.9, R65.20] Acute decompensated heart failure ([place]) [I50.9]   Fluid volume overload with chronic kidney disease stage IV.  Currently prescribed torsemide 10 mg daily.    Weaned to room air.  Creatinine increase expected on furosemide drip.  Diuresis successful, will continue diuresis at this time with close monitoring of renal function.  It was discussed with the patient again that a decision of whether to initiate dialysis will be needed within the next month.  Patient remains hesitant to make official decision of whether to begin.  Patient encouraged to consider peritoneal dialysis.  2.  Acute respiratory distress with diastolic heart failure.  EF greater than 75% with a severe LVH and grade 1 diastolic dysfunction.  3.  Hypertension with chronic kidney disease.  Home regimen includes amlodipine, carvedilol, furosemide and hydralazine.  Currently receiving furosemide drip.  All other medications currently prescribed.  Blood pressure currently stable, 107/75.  4. Anemia of chronic kidney disease Lab Results  Component Value Date   HGB 8.9 (L) 02/05/2022    We will continue to monitor hemoglobin and  intervene if needed.  5. Secondary Hyperparathyroidism:   Lab Results  Component Value Date   CALCIUM 8.4 (L) 02/06/2022   PHOS 3.2 02/04/2022    We will continue to monitor bone minerals during this admission.  LOS: 2 Rupa Lagan 7/19/20231:41 PM

## 2022-02-07 DIAGNOSIS — N179 Acute kidney failure, unspecified: Secondary | ICD-10-CM | POA: Diagnosis not present

## 2022-02-07 DIAGNOSIS — I161 Hypertensive emergency: Secondary | ICD-10-CM | POA: Diagnosis not present

## 2022-02-07 DIAGNOSIS — I5033 Acute on chronic diastolic (congestive) heart failure: Secondary | ICD-10-CM | POA: Diagnosis not present

## 2022-02-07 DIAGNOSIS — J9601 Acute respiratory failure with hypoxia: Secondary | ICD-10-CM | POA: Diagnosis not present

## 2022-02-07 LAB — CBC
HCT: 30.5 % — ABNORMAL LOW (ref 36.0–46.0)
Hemoglobin: 9.4 g/dL — ABNORMAL LOW (ref 12.0–15.0)
MCH: 25.5 pg — ABNORMAL LOW (ref 26.0–34.0)
MCHC: 30.8 g/dL (ref 30.0–36.0)
MCV: 82.9 fL (ref 80.0–100.0)
Platelets: 268 10*3/uL (ref 150–400)
RBC: 3.68 MIL/uL — ABNORMAL LOW (ref 3.87–5.11)
RDW: 17 % — ABNORMAL HIGH (ref 11.5–15.5)
WBC: 9 10*3/uL (ref 4.0–10.5)
nRBC: 0 % (ref 0.0–0.2)

## 2022-02-07 LAB — BASIC METABOLIC PANEL
Anion gap: 7 (ref 5–15)
BUN: 61 mg/dL — ABNORMAL HIGH (ref 8–23)
CO2: 22 mmol/L (ref 22–32)
Calcium: 8.5 mg/dL — ABNORMAL LOW (ref 8.9–10.3)
Chloride: 109 mmol/L (ref 98–111)
Creatinine, Ser: 4.11 mg/dL — ABNORMAL HIGH (ref 0.44–1.00)
GFR, Estimated: 11 mL/min — ABNORMAL LOW (ref >60)
Glucose, Bld: 90 mg/dL (ref 70–99)
Potassium: 4.5 mmol/L (ref 3.5–5.1)
Sodium: 138 mmol/L (ref 135–145)

## 2022-02-07 LAB — GLUCOSE, CAPILLARY
Glucose-Capillary: 102 mg/dL — ABNORMAL HIGH (ref 70–99)
Glucose-Capillary: 125 mg/dL — ABNORMAL HIGH (ref 70–99)
Glucose-Capillary: 176 mg/dL — ABNORMAL HIGH (ref 70–99)

## 2022-02-07 LAB — HEPATITIS B CORE ANTIBODY, TOTAL: Hep B Core Total Ab: NONREACTIVE

## 2022-02-07 LAB — HEPATITIS B SURFACE ANTIGEN: Hepatitis B Surface Ag: NONREACTIVE

## 2022-02-07 NOTE — Discharge Summary (Signed)
Physician Discharge Summary   Patient: Dorothy Walton MRN: [id number] DOB: 06-30-1951  Admit date:     02/04/2022  Discharge date: 02/07/22  Discharge Physician: Jennye Boroughs   PCP: Care, [facility]   Recommendations at discharge:     Discharge Diagnoses: Principal Problem:   Acute on chronic diastolic CHF (congestive heart failure) ([place]) Active Problems:   Hypertensive emergency   Acute respiratory failure with hypoxia ([place])   AKI (acute kidney injury) ([place])   Chronic kidney disease, stage 4 (severe) (HCC)   HLD (hyperlipidemia)   Diabetes mellitus, type 2 (HCC)   Anemia in chronic kidney disease   Benign hypertensive kidney disease with chronic kidney disease  Resolved Problems:   * No resolved hospital problems. [facility] Course: [name] is a 71 year old female with history of CKD stage IV, insulin-dependent diabetes mellitus, hypertension, who presented to the hospital with low oxygen saturation, leg swelling and shortness of breath.    Per ED report, patient's saturation was in the high 80s at outpatient clinic prompting outpatient clinic to send patient to the emergency department for further evaluation.  Initial vitals in the emergency department showed temperature of 98.1, respiration rate of 18, heart rate of 81, blood pressure 211/99, SPO2 of 90% on room air.  She was admitted to the hospital for acute on chronic diastolic CHF and hypertensive emergency complicated by acute hypoxic respiratory failure.  She was treated with IV Lasix.  She diuresed well and, peripheral edema and hypoxia have resolved.  Hypertension has improved as well with treatment.  Unfortunately, she developed AKI.  Creatinine continued to trend upward despite discontinuation of diuretics.  Nephrologist recommended initiation of hemodialysis on this hospital visit.  However, patient declined hemodialysis at this time.  She understands that she is at increased risk for decompensation and  readmission.  She prefers to follow-up with a nephrologist as an outpatient to discuss this further.  Case was discussed with Dr. Holley Raring, nephrologist.  She is deemed stable for discharge to home today.       Consultants: Nephrologist Procedures performed: None Disposition: Home Diet recommendation:  Discharge Diet Orders (From admission, onward)     Start     Ordered   02/07/22 0000  Diet Carb Modified        02/07/22 1524   02/07/22 0000  Diet renal with fluid restriction        02/07/22 1524           Renal diet, diabetic diet DISCHARGE MEDICATION: Allergies as of 02/07/2022       Reactions   Atorvastatin Rash   Penicillins Rash        Medication List     STOP taking these medications    metFORMIN 750 MG 24 hr tablet Commonly known as: GLUCOPHAGE-XR       TAKE these medications    acetaminophen 325 MG tablet Commonly known as: TYLENOL Take 650 mg by mouth every 6 (six) hours as needed for mild pain.   amLODipine 10 MG tablet Commonly known as: NORVASC Take 10 mg by mouth daily.   calcitRIOL 0.25 MCG capsule Commonly known as: ROCALTROL Take 0.25 mcg by mouth daily.   carvedilol 80 MG 24 hr capsule Commonly known as: COREG CR Take 80 mg by mouth daily.   Fifty50 Glucose Meter 2.0 w/Device Kit Check BG once daily as needed Dx E11.9   OneTouch Verio w/Device Kit   Fish Oil 1000 MG Caps Take 1,000 mg by mouth  daily.   hydrALAZINE 100 MG tablet Commonly known as: APRESOLINE Take 100 mg by mouth 3 (three) times daily.   losartan 100 MG tablet Commonly known as: COZAAR Take 100 mg by mouth daily.   Precision QID Test test strip Generic drug: glucose blood Check BG twicec daily as needed Dx E11.9   torsemide 10 MG tablet Commonly known as: DEMADEX Take 10 mg by mouth daily.   Tyler Aas FlexTouch 100 UNIT/ML FlexTouch Pen Generic drug: insulin degludec Inject 36 Units into the skin daily.        Discharge Exam: Filed Weights    02/04/22 1225 02/05/22 1706  Weight: 94.8 kg 94.1 kg   GEN: NAD SKIN: Warm and dry EYES: No pallor or icterus ENT: MMM CV: RRR PULM: CTA B ABD: soft, obese, NT, +BS CNS: AAO x 3, non focal EXT: No edema or tenderness   Condition at discharge: good  The results of significant diagnostics from this hospitalization (including imaging, microbiology, ancillary and laboratory) are listed below for reference.   Imaging Studies: ECHOCARDIOGRAM COMPLETE  Result Date: 02/05/2022    ECHOCARDIOGRAM REPORT   Patient Name:   Dorothy Walton Date of Exam: 02/05/2022 Medical Rec #:  [id number]  Height:       69.0 in Accession #:    [id number] Weight:       209.0 lb Date of Birth:  03/18/1951  BSA:          2.105 m Patient Age:    71 years   BP:           167/90 mmHg Patient Gender: F          HR:           64 bpm. Exam Location:  [facility] Procedure: 2D Echo, Cardiac Doppler and Color Doppler Indications:     R06.00 Dypnea  History:         Patient has no prior history of Echocardiogram examinations.                  Risk Factors:Dyslipidemia, Hypertension and Diabetes.  Sonographer:     Rosalia Hammers Referring Phys:  [id number] AMY N COX Diagnosing Phys: Nelva Bush MD  Sonographer Comments: Image acquisition challenging due to patient body habitus and Image acquisition challenging due to respiratory motion. IMPRESSIONS  1. Left ventricular ejection fraction, by estimation, is >75%. The left ventricle has hyperdynamic function. The left ventricle has no regional wall motion abnormalities. There is severe left ventricular hypertrophy. Left ventricular diastolic parameters are consistent with Grade I diastolic dysfunction (impaired relaxation). Elevated left atrial pressure.  2. Right ventricular systolic function is normal. The right ventricular size is normal.  3. The mitral valve is abnormal. Trivial mitral valve regurgitation. No evidence of mitral stenosis.  4. The aortic valve has an indeterminant number of cusps.  Aortic valve regurgitation is not visualized. No aortic stenosis is present.  5. Aortic dilatation noted. 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  6. The inferior vena cava is normal in size with greater than 50% respiratory variability, suggesting right atrial pressure of 3 mmHg. FINDINGS  Left Ventricle: Left ventricular ejection fraction, by estimation, is >75%. The left ventricle has hyperdynamic function. The left ventricle has no regional wall motion abnormalities. The left ventricular internal cavity size was normal in size. There is severe left ventricular hypertrophy. Left ventricular diastolic parameters are consistent with Grade I diastolic dysfunction (impaired relaxation). Elevated left  atrial pressure. Right Ventricle: The right ventricular size is normal. No increase in right ventricular wall thickness. Right ventricular systolic function is normal. Left Atrium: Left atrial size was normal in size. Right Atrium: Right atrial size was normal in size. Pericardium: There is no evidence of pericardial effusion. Mitral Valve: The mitral valve is abnormal. There is mild thickening of the posterior mitral valve leaflet(s). There is mild calcification of the posterior mitral valve leaflet(s). Mild mitral annular calcification. Trivial mitral valve regurgitation. No  evidence of mitral valve stenosis. Tricuspid Valve: The tricuspid valve is normal in structure. Tricuspid valve regurgitation is not demonstrated. Aortic Valve: The aortic valve has an indeterminant number of cusps. Aortic valve regurgitation is not visualized. No aortic stenosis is present. Aortic valve mean gradient measures 6.0 mmHg. Aortic valve peak gradient measures 9.5 mmHg. Aortic valve area, by VTI measures 2.59 cm. Pulmonic Valve: The pulmonic valve was grossly normal. Pulmonic valve regurgitation is not visualized. No evidence of pulmonic stenosis. Aorta: Aortic dilatation noted. There  is mild dilatation of the aortic root and of the ascending aorta, measuring 42 mm. Pulmonary Artery: The pulmonary artery is of normal size. Venous: The inferior vena cava is normal in size with greater than 50% respiratory variability, suggesting right atrial pressure of 3 mmHg. IAS/Shunts: No atrial level shunt detected by color flow Doppler.  LEFT VENTRICLE PLAX 2D LVIDd:         3.84 cm   Diastology LVIDs:         2.08 cm   LV e' medial:    5.77 cm/s LV PW:         1.99 cm   LV E/e' medial:  18.7 LV IVS:        2.07 cm   LV e' lateral:   5.66 cm/s LVOT diam:     2.00 cm   LV E/e' lateral: 19.1 LV SV:         90 LV SV Index:   43 LVOT Area:     3.14 cm  RIGHT VENTRICLE RV Basal diam:  2.89 cm RV S prime:     14.90 cm/s TAPSE (M-mode): 2.3 cm LEFT ATRIUM             Index        RIGHT ATRIUM           Index LA diam:        3.60 cm 1.71 cm/m   RA Area:     15.30 cm LA Vol (A2C):   66.9 ml 31.78 ml/m  RA Volume:   37.50 ml  17.81 ml/m LA Vol (A4C):   49.7 ml 23.61 ml/m LA Biplane Vol: 59.7 ml 28.36 ml/m  AORTIC VALVE AV Area (Vmax):    2.73 cm AV Area (Vmean):   2.64 cm AV Area (VTI):     2.59 cm AV Vmax:           154.00 cm/s AV Vmean:          110.000 cm/s AV VTI:            0.348 m AV Peak Grad:      9.5 mmHg AV Mean Grad:      6.0 mmHg LVOT Vmax:         134.00 cm/s LVOT Vmean:        92.600 cm/s LVOT VTI:          0.287 m LVOT/AV VTI ratio: 0.82  AORTA Ao Root diam: 4.20 cm  MITRAL VALVE MV Area (PHT): 2.33 cm     SHUNTS MV Decel Time: 325 msec     Systemic VTI:  0.29 m MV E velocity: 108.00 cm/s  Systemic Diam: 2.00 cm MV A velocity: 149.00 cm/s MV E/A ratio:  0.72 Nelva Bush MD Electronically signed by Nelva Bush MD Signature Date/Time: 02/05/2022/3:28:04 PM    Final    DG Chest 2 View  Result Date: 02/04/2022 CLINICAL DATA:  Short of breath EXAM: CHEST - 2 VIEW COMPARISON:  03/02/2021 FINDINGS: Patchy opacities bilaterally, right greater than left. Trace pleural effusions. No  pneumothorax. Stable mild cardiomegaly. IMPRESSION: Cardiomegaly, trace pleural effusions, and patchy opacities bilaterally reflecting edema or less likely pneumonia. Electronically Signed   By: Macy Mis M.D.   On: 02/04/2022 13:10    Microbiology: Results for orders placed or performed during the hospital encounter of 02/04/22  Urine Culture     Status: None   Collection Time: 02/04/22  5:02 PM   Specimen: Urine, Clean Catch  Result Value Ref Range Status   Specimen Description   Final    URINE, CLEAN CATCH Performed at [facility], [address] [place], [place] [address]    Special Requests   Final    NONE Performed at [facility], [address] [place], [place] [address]    Culture   Final    NO GROWTH Performed at [facility], [place] [address] [place], [place] [address]    Report Status 02/05/2022 FINAL  Final  Culture, blood (Routine X 2) w Reflex to ID Panel     Status: None (Preliminary result)   Collection Time: 02/04/22  5:15 PM   Specimen: BLOOD  Result Value Ref Range Status   Specimen Description BLOOD BLOOD LEFT HAND  Final   Special Requests AEROBIC BOTTLE ONLY Blood Culture adequate volume  Final   Culture   Final    NO GROWTH 3 DAYS Performed at [facility], [address] [place], [place] [address]    Report Status PENDING  Incomplete  Culture, blood (Routine X 2) w Reflex to ID Panel     Status: None (Preliminary result)   Collection Time: 02/04/22  5:22 PM   Specimen: BLOOD  Result Value Ref Range Status   Specimen Description BLOOD LEFT ANTECUBITAL  Final   Special Requests   Final    BOTTLES DRAWN AEROBIC AND ANAEROBIC Blood Culture results may not be optimal due to an excessive volume of blood received in culture bottles   Culture   Final    NO GROWTH 3 DAYS Performed at [facility], [address] [place], [place] [address]    Report Status PENDING  Incomplete  SARS Coronavirus 2 by RT  PCR (hospital order, performed in [place] hospital lab) *cepheid single result test* Anterior Nasal Swab     Status: None   Collection Time: 02/05/22  3:13 PM   Specimen: Anterior Nasal Swab  Result Value Ref Range Status   SARS Coronavirus 2 by RT PCR NEGATIVE NEGATIVE Final    Comment: (NOTE) SARS-CoV-2 target nucleic acids are NOT DETECTED.  The SARS-CoV-2 RNA is generally detectable in upper and lower respiratory specimens during the acute phase of infection. The lowest concentration of SARS-CoV-2 viral copies this assay can detect is 250 copies / mL. A negative result does not preclude SARS-CoV-2 infection and should not be used as the sole basis for treatment or other patient management decisions.  A  negative result may occur with improper specimen collection / handling, submission of specimen other than nasopharyngeal swab, presence of viral mutation(s) within the areas targeted by this assay, and inadequate number of viral copies (<250 copies / mL). A negative result must be combined with clinical observations, patient history, and epidemiological information.  Fact Sheet for Patients:   https://www.patel.info/  Fact Sheet for Healthcare Providers: https://hall.com/  This test is not yet approved or  cleared by the [place] FDA and has been authorized for detection and/or diagnosis of SARS-CoV-2 by FDA under an Emergency Use Authorization (EUA).  This EUA will remain in effect (meaning this test can be used) for the duration of the COVID-19 declaration under Section 564(b)(1) of the Act, 21 U.S.C. section 360bbb-3(b)(1), unless the authorization is terminated or revoked sooner.  Performed at [facility], [address] [place], [place] [address]     Labs: CBC: Recent Labs  Lab 02/04/22 1206 02/05/22 0505 02/07/22 0615  WBC 13.1* 9.8 9.0  HGB 10.0* 8.9* 9.4*  HCT 34.0* 30.1* 30.5*  MCV 85.2 85.0  82.9  PLT 266 256 748   Basic Metabolic Panel: Recent Labs  Lab 02/04/22 1206 02/04/22 1659 02/05/22 0505 02/06/22 0402 02/07/22 0615  NA 141  --  140 139 138  K 5.1  --  4.5 4.3 4.5  CL 112*  --  112* 109 109  CO2 19*  --  $R'22 22 22  'RT$ GLUCOSE 117*  --  62* 87 90  BUN 39*  --  40* 57* 61*  CREATININE 3.07*  --  3.02* 3.83* 4.11*  CALCIUM 8.9  --  8.5* 8.4* 8.5*  MG  --  2.2  --   --   --   PHOS  --  3.2  --   --   --    Liver Function Tests: No results for input(s): "AST", "ALT", "ALKPHOS", "BILITOT", "PROT", "ALBUMIN" in the last 168 hours. CBG: Recent Labs  Lab 02/06/22 1217 02/06/22 1532 02/06/22 2032 02/07/22 0718 02/07/22 1124  GLUCAP 91 108* 90 102* 125*    Discharge time spent: greater than 30 minutes.  Signed: Jennye Boroughs, MD [facility] 02/07/2022

## 2022-02-07 NOTE — Progress Notes (Signed)
[facility]  ROUNDING NOTE   Subjective:   Dorothy Walton is a 71 y.o female with past medical history of diabetes, hypertension, and chronic kidney disease stage IV. Patient presents to the ED with shortness of breath from outpatient clinic. She has been admitted for Severe sepsis with acute organ dysfunction ([place]) [A41.9, R65.20] Acute decompensated heart failure ([place]) [I50.9]  Patient is known to our practice and is followed by  Dr Holley Raring.   Patient seen sitting up in bed Completed breakfast tray at bedside Reports breathing has improved Renal function continue to decline  Creatinine 4.1 Urine output 1.4 L in preceding 24 hours   Objective:  Vital signs in last 24 hours:  Temp:  [98 F (36.7 C)-98.9 F (37.2 C)] 98.6 F (37 C) (07/20 1119) Pulse Rate:  [64-78] 78 (07/20 1119) Resp:  [18-20] 19 (07/20 1119) BP: (118-149)/(59-82) 131/63 (07/20 1119) SpO2:  [95 %-97 %] 97 % (07/20 1119)  Weight change:  Filed Weights   02/04/22 1225 02/05/22 1706  Weight: 94.8 kg 94.1 kg    Intake/Output: I/O last 3 completed shifts: In: 720 [P.O.:720] Out: 3625 [Urine:3625]   Intake/Output this shift:  No intake/output data recorded.  Physical Exam: General: NAD, resting comfortably  Head: Normocephalic, atraumatic. Moist oral mucosal membranes  Eyes: Anicteric  Lungs:  Diminished in bases, normal effort  Heart: Regular rate and rhythm  Abdomen:  Soft, nontender  Extremities: No peripheral edema.  Neurologic: Nonfocal, moving all four extremities  Skin: No lesions  Access: None    Basic Metabolic Panel: Recent Labs  Lab 02/04/22 1206 02/04/22 1659 02/05/22 0505 02/06/22 0402 02/07/22 0615  NA 141  --  140 139 138  K 5.1  --  4.5 4.3 4.5  CL 112*  --  112* 109 109  CO2 19*  --  22 22 22   GLUCOSE 117*  --  62* 87 90  BUN 39*  --  40* 57* 61*  CREATININE 3.07*  --  3.02* 3.83* 4.11*  CALCIUM 8.9  --  8.5* 8.4* 8.5*  MG  --  2.2  --   --   --   PHOS  --   3.2  --   --   --      Liver Function Tests: No results for input(s): "AST", "ALT", "ALKPHOS", "BILITOT", "PROT", "ALBUMIN" in the last 168 hours. No results for input(s): "LIPASE", "AMYLASE" in the last 168 hours. No results for input(s): "AMMONIA" in the last 168 hours.  CBC: Recent Labs  Lab 02/04/22 1206 02/05/22 0505 02/07/22 0615  WBC 13.1* 9.8 9.0  HGB 10.0* 8.9* 9.4*  HCT 34.0* 30.1* 30.5*  MCV 85.2 85.0 82.9  PLT 266 256 268     Cardiac Enzymes: No results for input(s): "CKTOTAL", "CKMB", "CKMBINDEX", "TROPONINI" in the last 168 hours.  BNP: Invalid input(s): "POCBNP"  CBG: Recent Labs  Lab 02/06/22 1217 02/06/22 1532 02/06/22 2032 02/07/22 0718 02/07/22 1124  GLUCAP 91 108* 90 102* 125*     Microbiology: Results for orders placed or performed during the hospital encounter of 02/04/22  Urine Culture     Status: None   Collection Time: 02/04/22  5:02 PM   Specimen: Urine, Clean Catch  Result Value Ref Range Status   Specimen Description   Final    URINE, CLEAN CATCH Performed at [facility], [address], [place], [place] [address]    Special Requests   Final    NONE Performed at [facility],  [place], [place] [address]    Culture   Final    NO GROWTH Performed at [facility], [place] [address] [place], [place] [address]    Report Status 02/05/2022 FINAL  Final  Culture, blood (Routine X 2) w Reflex to ID Panel     Status: None (Preliminary result)   Collection Time: 02/04/22  5:15 PM   Specimen: BLOOD  Result Value Ref Range Status   Specimen Description BLOOD BLOOD LEFT HAND  Final   Special Requests AEROBIC BOTTLE ONLY Blood Culture adequate volume  Final   Culture   Final    NO GROWTH 3 DAYS Performed at [facility], [address], [place], [place] [address]    Report Status PENDING  Incomplete  Culture, blood (Routine X 2) w Reflex to ID Panel     Status: None (Preliminary  result)   Collection Time: 02/04/22  5:22 PM   Specimen: BLOOD  Result Value Ref Range Status   Specimen Description BLOOD LEFT ANTECUBITAL  Final   Special Requests   Final    BOTTLES DRAWN AEROBIC AND ANAEROBIC Blood Culture results may not be optimal due to an excessive volume of blood received in culture bottles   Culture   Final    NO GROWTH 3 DAYS Performed at [facility], [address], [place], [place] [address]    Report Status PENDING  Incomplete  SARS Coronavirus 2 by RT PCR (hospital order, performed in [place] hospital lab) *cepheid single result test* Anterior Nasal Swab     Status: None   Collection Time: 02/05/22  3:13 PM   Specimen: Anterior Nasal Swab  Result Value Ref Range Status   SARS Coronavirus 2 by RT PCR NEGATIVE NEGATIVE Final    Comment: (NOTE) SARS-CoV-2 target nucleic acids are NOT DETECTED.  The SARS-CoV-2 RNA is generally detectable in upper and lower respiratory specimens during the acute phase of infection. The lowest concentration of SARS-CoV-2 viral copies this assay can detect is 250 copies / mL. A negative result does not preclude SARS-CoV-2 infection and should not be used as the sole basis for treatment or other patient management decisions.  A negative result may occur with improper specimen collection / handling, submission of specimen other than nasopharyngeal swab, presence of viral mutation(s) within the areas targeted by this assay, and inadequate number of viral copies (<250 copies / mL). A negative result must be combined with clinical observations, patient history, and epidemiological information.  Fact Sheet for Patients:   https://www.patel.info/  Fact Sheet for Healthcare Providers: https://hall.com/  This test is not yet approved or  cleared by the [place] FDA and has been authorized for detection and/or diagnosis of SARS-CoV-2 by FDA under an Emergency Use  Authorization (EUA).  This EUA will remain in effect (meaning this test can be used) for the duration of the COVID-19 declaration under Section 564(b)(1) of the Act, 21 U.S.C. section 360bbb-3(b)(1), unless the authorization is terminated or revoked sooner.  Performed at [facility], [address] [place],  [address]     Coagulation Studies: Recent Labs    02/05/22 0505  LABPROT 14.1  INR 1.1     Urinalysis: Recent Labs    02/04/22 1702  COLORURINE STRAW*  LABSPEC 1.008  PHURINE 7.0  GLUCOSEU 50*  HGBUR SMALL*  BILIRUBINUR NEGATIVE  KETONESUR NEGATIVE  PROTEINUR 100*  NITRITE NEGATIVE  LEUKOCYTESUR NEGATIVE       Imaging: ECHOCARDIOGRAM  COMPLETE  Result Date: 02/05/2022    ECHOCARDIOGRAM REPORT   Patient Name:   Dorothy Walton Date of Exam: 02/05/2022 Medical Rec #:  [id number]  Height:       69.0 in Accession #:    [id number] Weight:       209.0 lb Date of Birth:  [birth year]  BSA:          2.105 m Patient Age:    19 years   BP:           167/90 mmHg Patient Gender: F          HR:           64 bpm. Exam Location:  [facility] Procedure: 2D Echo, Cardiac Doppler and Color Doppler Indications:     R06.00 Dypnea  History:         Patient has no prior history of Echocardiogram examinations.                  Risk Factors:Dyslipidemia, Hypertension and Diabetes.  Sonographer:     Rosalia Hammers Referring Phys:  [id number] AMY N COX Diagnosing Phys: Nelva Bush MD  Sonographer Comments: Image acquisition challenging due to patient body habitus and Image acquisition challenging due to respiratory motion. IMPRESSIONS  1. Left ventricular ejection fraction, by estimation, is >75%. The left ventricle has hyperdynamic function. The left ventricle has no regional wall motion abnormalities. There is severe left ventricular hypertrophy. Left ventricular diastolic parameters are consistent with Grade I diastolic dysfunction (impaired relaxation). Elevated left atrial pressure.  2.  Right ventricular systolic function is normal. The right ventricular size is normal.  3. The mitral valve is abnormal. Trivial mitral valve regurgitation. No evidence of mitral stenosis.  4. The aortic valve has an indeterminant number of cusps. Aortic valve regurgitation is not visualized. No aortic stenosis is present.  5. Aortic dilatation noted. 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  6. The inferior vena cava is normal in size with greater than 50% respiratory variability, suggesting right atrial pressure of 3 mmHg. FINDINGS  Left Ventricle: Left ventricular ejection fraction, by estimation, is >75%. The left ventricle has hyperdynamic function. The left ventricle has no regional wall motion abnormalities. The left ventricular internal cavity size was normal in size. There is severe left ventricular hypertrophy. Left ventricular diastolic parameters are consistent with Grade I diastolic dysfunction (impaired relaxation). Elevated left atrial pressure. Right Ventricle: The right ventricular size is normal. No increase in right ventricular wall thickness. Right ventricular systolic function is normal. Left Atrium: Left atrial size was normal in size. Right Atrium: Right atrial size was normal in size. Pericardium: There is no evidence of pericardial effusion. Mitral Valve: The mitral valve is abnormal. There is mild thickening of the posterior mitral valve leaflet(s). There is mild calcification of the posterior mitral valve leaflet(s). Mild mitral annular calcification. Trivial mitral valve regurgitation. No  evidence of mitral valve stenosis. Tricuspid Valve: The tricuspid valve is normal in structure. Tricuspid valve regurgitation is not demonstrated. Aortic Valve: The aortic valve has an indeterminant number of cusps. Aortic valve regurgitation is not visualized. No aortic stenosis is present. Aortic valve mean gradient measures 6.0 mmHg. Aortic valve peak  gradient measures 9.5 mmHg. Aortic valve area, by VTI measures 2.59 cm. Pulmonic Valve: The pulmonic valve was grossly normal. Pulmonic valve regurgitation is not visualized. No evidence of pulmonic stenosis. Aorta: Aortic dilatation noted. There is mild dilatation of the aortic  root and of the ascending aorta, measuring 42 mm. Pulmonary Artery: The pulmonary artery is of normal size. Venous: The inferior vena cava is normal in size with greater than 50% respiratory variability, suggesting right atrial pressure of 3 mmHg. IAS/Shunts: No atrial level shunt detected by color flow Doppler.  LEFT VENTRICLE PLAX 2D LVIDd:         3.84 cm   Diastology LVIDs:         2.08 cm   LV e' medial:    5.77 cm/s LV PW:         1.99 cm   LV E/e' medial:  18.7 LV IVS:        2.07 cm   LV e' lateral:   5.66 cm/s LVOT diam:     2.00 cm   LV E/e' lateral: 19.1 LV SV:         90 LV SV Index:   43 LVOT Area:     3.14 cm  RIGHT VENTRICLE RV Basal diam:  2.89 cm RV S prime:     14.90 cm/s TAPSE (M-mode): 2.3 cm LEFT ATRIUM             Index        RIGHT ATRIUM           Index LA diam:        3.60 cm 1.71 cm/m   RA Area:     15.30 cm LA Vol (A2C):   66.9 ml 31.78 ml/m  RA Volume:   37.50 ml  17.81 ml/m LA Vol (A4C):   49.7 ml 23.61 ml/m LA Biplane Vol: 59.7 ml 28.36 ml/m  AORTIC VALVE AV Area (Vmax):    2.73 cm AV Area (Vmean):   2.64 cm AV Area (VTI):     2.59 cm AV Vmax:           154.00 cm/s AV Vmean:          110.000 cm/s AV VTI:            0.348 m AV Peak Grad:      9.5 mmHg AV Mean Grad:      6.0 mmHg LVOT Vmax:         134.00 cm/s LVOT Vmean:        92.600 cm/s LVOT VTI:          0.287 m LVOT/AV VTI ratio: 0.82  AORTA Ao Root diam: 4.20 cm MITRAL VALVE MV Area (PHT): 2.33 cm     SHUNTS MV Decel Time: 325 msec     Systemic VTI:  0.29 m MV E velocity: 108.00 cm/s  Systemic Diam: 2.00 cm MV A velocity: 149.00 cm/s MV E/A ratio:  0.72 Christopher End MD Electronically signed by Nelva Bush MD Signature Date/Time:  02/05/2022/3:28:04 PM    Final      Medications:      amLODipine  10 mg Oral Daily   calcitRIOL  0.25 mcg Oral Daily   carvedilol  80 mg Oral Daily   heparin  5,000 Units Subcutaneous Q8H   hydrALAZINE  100 mg Oral TID   insulin aspart  0-5 Units Subcutaneous QHS   insulin aspart  0-9 Units Subcutaneous TID WC   insulin glargine-yfgn  28 Units Subcutaneous Daily   acetaminophen **OR** acetaminophen, ondansetron **OR** ondansetron (ZOFRAN) IV  Assessment/ Plan:  [name] is a 71 y.o.  female with past medical history of diabetes, hypertension, and chronic kidney disease stage IV. Patient presents to the  ED with shortness of breath from outpatient clinic. She has been admitted for Severe sepsis with acute organ dysfunction ([facility]) [A41.9, R65.20] Acute decompensated heart failure ([place]) [I50.9]   Fluid volume overload with chronic kidney disease stage IV.  Currently prescribed torsemide 10 mg daily prior to admission.    Remains on room air. Furosemide drip stopped. Successful diuresis. Renal function continues to decline. Discussed with patient that dialysis will be required in the near future. She is not interested in home modalities and would like in-center treatments. Offered to start patient during this admission, however patient would like to discharge and follow up outpatient. Will schedule close follow up outpatient.   2.  Acute respiratory distress with diastolic heart failure.  EF greater than 75% with a severe LVH and grade 1 diastolic dysfunction.  3.  Hypertension with chronic kidney disease.  Home regimen includes amlodipine, carvedilol, furosemide and hydralazine.  Currently receiving furosemide drip.  All other medications currently prescribed.  Blood pressure currently 131/63.  4. Anemia of chronic kidney disease Lab Results  Component Value Date   HGB 9.4 (L) 02/07/2022   Hgb stable We will continue to monitor hemoglobin and intervene if needed.  5. Secondary  Hyperparathyroidism:   Lab Results  Component Value Date   CALCIUM 8.5 (L) 02/07/2022   PHOS 3.2 02/04/2022    Calcium and phosphorus at goal. Will Continue to monitor   LOS: 3 Ulisses Vondrak 7/20/20231:13 PM

## 2022-02-07 NOTE — Care Management Important Message (Signed)
Important Message  Patient Details  Name: Dorothy Walton MRN: [id number] Date of Birth: August 21, 1950   Medicare Important Message Given:  Yes     Dannette Barbara 02/07/2022, 1:53 PM

## 2022-02-07 NOTE — Consult Note (Signed)
   Heart Failure Nurse Navigator Note  HFpEF greater than 75%.  Severe LVH.  Grade 1 diastolic dysfunction.  Patient presented to the emergency room, her urology office with complaints of noted hypoxia, worsening swelling of her ankles, shortness of breath, orthopnea.  BNP was 271 chest x-ray suggestive of edema.  Comorbidities:  Hyperlipidemia Diabetes Anemia of chronic disease Chronic kidney disease stage IV Hypertensive emergency  Medications:  Amlodipine 10 mg daily Carvedilol 80 mg daily Hydralazine 100 mg 3 times a day   Labs:  Sodium 138, potassium 4.5, chloride 109, CO2 22, BUN 61, creatinine 4.11, GFR 11.  Hemoglobin 9.4, hematocrit 30.5. Weight not documented for today Blood pressure 131/63 Intake 720 mL Output 1425 mL  Initial meeting with patient on this admission.  She states starting on Saturday at home that her ankles were swelling.  She first thought that she been bit by a mosquito.  She states then on Sunday morning she noted worsening swelling involving both ankles, she had also experienced orthopnea.  She states that she has a scale at home but does not weigh herself on a daily basis.  Thus the importance of waiting daily and reporting a 2 to 3 pound weight gain overnight or total of 5 pounds within the week.  Also discussed to report worsening symptoms such as increased swelling of her lower legs, stomach, hands, increasing shortness of breath, PND and orthopnea.  Discussed not using salt at the table and being more cognizant of using foods with the lower sodium content.  States that she does like to eat a local seafood restaurant, usually gets fried foods, discussed getting the fish broiled baked or grilled.  She states that she also likes to eat at [employer] fried chicken, discussed if she and could remove the skin from the chicken but she still needs to realize she is eating foods higher in sodium.  Also to about the importance of taking her medications  daily.  She does not have a problem with obtaining her medications.  Discussed what heart failure means.  Discussed also BP and her blood pressures in check.  Discussed follow-up in the outpatient heart failure clinic.  She has an appointment with the outpatient heart failure clinic on August 3 at 22 AM.  She was given the living with heart failure teaching booklet, zone magnet, info on low-sodium and heart failure along with a weight chart.  She had no further questions.   Pricilla Riffle RN [facility]

## 2022-02-08 LAB — HEPATITIS B E ANTIBODY: Hep B E Ab: NEGATIVE

## 2022-02-08 LAB — HEPATITIS B SURFACE ANTIBODY, QUANTITATIVE: Hep B S AB Quant (Post): <3.1 m[IU]/mL — ABNORMAL LOW (ref >9.9)

## 2022-02-09 LAB — CULTURE, BLOOD (ROUTINE X 2)
Culture: NO GROWTH
Culture: NO GROWTH
Special Requests: ADEQUATE

## 2022-02-20 NOTE — Progress Notes (Deleted)
   Patient ID: Dorothy Walton, female    DOB: 1951-04-21, 71 y.o.   MRN: [id number]  HPI  Ms Garron is a 71 y/o female with a history of  Echo report from 02/05/22 reviewed and showed an EF of >75% along with severe LVH  Admitted 02/04/22 due to leg swelling and shortness of breath due to acute on chronic heart failure. Initially given IV lasix with transition to oral diuretics. Developed AKI and nephrology wanted to start dialysis but patient deferred. Discharged after 3 days.   She presents today for her initial visit with a chief complaint of   Review of Systems    Physical Exam  Assessment & Plan:  1: Chronic heart failure with preserved ejection fraction with structural changes (LVH)- - NYHA class

## 2022-02-21 ENCOUNTER — Ambulatory Visit: Payer: Medicare HMO | Admitting: Family

## 2022-02-21 ENCOUNTER — Telehealth: Payer: Self-pay | Admitting: Family

## 2022-02-21 NOTE — Telephone Encounter (Signed)
Patient did not show for her initial Heart Failure Clinic appointment on 02/21/22. Will attempt to reschedule.

## 2022-02-28 ENCOUNTER — Other Ambulatory Visit (INDEPENDENT_AMBULATORY_CARE_PROVIDER_SITE_OTHER): Payer: Self-pay | Admitting: Nephrology

## 2022-02-28 DIAGNOSIS — Z01818 Encounter for other preprocedural examination: Secondary | ICD-10-CM

## 2022-03-01 ENCOUNTER — Encounter (INDEPENDENT_AMBULATORY_CARE_PROVIDER_SITE_OTHER): Payer: Self-pay | Admitting: Vascular Surgery

## 2022-03-01 ENCOUNTER — Ambulatory Visit (INDEPENDENT_AMBULATORY_CARE_PROVIDER_SITE_OTHER): Payer: Medicare HMO | Admitting: Vascular Surgery

## 2022-03-01 ENCOUNTER — Ambulatory Visit (INDEPENDENT_AMBULATORY_CARE_PROVIDER_SITE_OTHER): Payer: Medicare HMO

## 2022-03-01 VITALS — BP 169/79 | HR 70 | Resp 18 | Ht 65.0 in | Wt 199.0 lb

## 2022-03-01 DIAGNOSIS — E1122 Type 2 diabetes mellitus with diabetic chronic kidney disease: Secondary | ICD-10-CM

## 2022-03-01 DIAGNOSIS — N185 Chronic kidney disease, stage 5: Secondary | ICD-10-CM | POA: Diagnosis not present

## 2022-03-01 DIAGNOSIS — Z01818 Encounter for other preprocedural examination: Secondary | ICD-10-CM | POA: Diagnosis not present

## 2022-03-01 DIAGNOSIS — E785 Hyperlipidemia, unspecified: Secondary | ICD-10-CM | POA: Diagnosis not present

## 2022-03-01 DIAGNOSIS — I10 Essential (primary) hypertension: Secondary | ICD-10-CM | POA: Diagnosis not present

## 2022-03-01 NOTE — Assessment & Plan Note (Signed)
blood glucose control important in reducing the progression of atherosclerotic disease. Also, involved in wound healing. On appropriate medications.  

## 2022-03-01 NOTE — Assessment & Plan Note (Signed)
lipid control important in reducing the progression of atherosclerotic disease. Continue statin therapy  

## 2022-03-01 NOTE — Progress Notes (Signed)
Patient ID: Dorothy Walton, female   DOB: July 04, 1951, 71 y.o.   MRN: [id number]  Chief Complaint  Patient presents with   Establish Care    Referred by Dr Cherylann Ratel    HPI Dorothy Walton is a 71 y.o. female.  I am asked to see the patient by Dr. Cherylann Ratel for evaluation of options for permanent dialysis access.  The patient has chronic kidney disease.  She is not a great historian and I am not really sure how long this has been going on, but is fairly advanced at this point now stage V chronic kidney disease.  She does not yet on dialysis.  She does not have a current dialysis access at this point.  She is right-hand dominant.  To evaluate her options for dialysis, arterial and venous studies were performed today.  She had normal arterial studies in both upper extremities.  The left upper extremity did not appear to have adequate basilic or cephalic vein for fistula creation.  In the right upper extremity, the basilic vein was marginal.  The cephalic vein is small in the forearm, but did appear to be marginal to adequate in the upper arm.     Past Medical History:  Diagnosis Date   COVID-19 12/2018   Diabetes mellitus without complication (HCC)    Hyperlipidemia    Hypertension    Pulmonary embolism (HCC) 12/2018   Stroke (HCC)    over 25 yrs ago.  No deficits   Wears dentures    partial upper    Past Surgical History:  Procedure Laterality Date   ABDOMINAL HYSTERECTOMY     CATARACT EXTRACTION W/PHACO Left 01/03/2020   Procedure: CATARACT EXTRACTION PHACO AND INTRAOCULAR LENS PLACEMENT (IOC) LEFT DIABETIC 1.82  00:20.7;  Surgeon: Dorothy Crane, MD;  Location: [facility];  Service: Ophthalmology;  Laterality: Left;  Diabetic - insulin and oral meds   COLONOSCOPY       Family History  Problem Relation Age of Onset   Hypertension Mother    Heart disease Mother    Hypertension Father    Cancer Father    Hypertension Maternal Grandmother       Social History   Tobacco Use    Smoking status: Never   Smokeless tobacco: Never  Vaping Use   Vaping Use: Never used  Substance Use Topics   Alcohol use: Not Currently    Alcohol/week: 0.0 standard drinks of alcohol   Drug use: Never     Allergies  Allergen Reactions   Atorvastatin Rash   Penicillins Rash    Current Outpatient Medications  Medication Sig Dispense Refill   amLODipine (NORVASC) 10 MG tablet Take 10 mg by mouth daily.     aspirin EC 81 MG tablet Take 81 mg by mouth daily. Swallow whole.     Blood Glucose Monitoring Suppl (FIFTY50 GLUCOSE METER 2.0) w/Device KIT Check BG once daily as needed Dx E11.9     Blood Glucose Monitoring Suppl (ONETOUCH VERIO) w/Device KIT      glucose blood (PRECISION QID TEST) test strip Check BG twicec daily as needed Dx E11.9     hydrALAZINE (APRESOLINE) 100 MG tablet Take 100 mg by mouth 3 (three) times daily.     losartan (COZAAR) 100 MG tablet Take 100 mg by mouth daily.     Omega-3 Fatty Acids (FISH OIL) 1000 MG CAPS Take 1,000 mg by mouth daily.     TRESIBA FLEXTOUCH 100 UNIT/ML FlexTouch Pen Inject 36 Units into  the skin daily.     acetaminophen (TYLENOL) 325 MG tablet Take 650 mg by mouth every 6 (six) hours as needed for mild pain. (Patient not taking: Reported on 03/01/2022)     calcitRIOL (ROCALTROL) 0.25 MCG capsule Take 0.25 mcg by mouth daily. (Patient not taking: Reported on 03/01/2022)     carvedilol (COREG CR) 80 MG 24 hr capsule Take 80 mg by mouth daily. (Patient not taking: Reported on 03/01/2022)     torsemide (DEMADEX) 10 MG tablet Take 10 mg by mouth daily. (Patient not taking: Reported on 03/01/2022)     No current facility-administered medications for this visit.      REVIEW OF SYSTEMS (Negative unless checked)  Constitutional: [] Weight loss  [] Fever  [] Chills Cardiac: [] Chest pain   [] Chest pressure   [] Palpitations   [] Shortness of breath when laying flat   [] Shortness of breath at rest   [] Shortness of breath with exertion. Vascular:   [] Pain in legs with walking   [] Pain in legs at rest   [] Pain in legs when laying flat   [] Claudication   [] Pain in feet when walking  [] Pain in feet at rest  [] Pain in feet when laying flat   [x] History of DVT   [] Phlebitis   [] Swelling in legs   [] Varicose veins   [] Non-healing ulcers Pulmonary:   [] Uses home oxygen   [] Productive cough   [] Hemoptysis   [] Wheeze  [] COPD   [] Asthma Neurologic:  [] Dizziness  [] Blackouts   [] Seizures   [x] History of stroke   [] History of TIA  [] Aphasia   [] Temporary blindness   [] Dysphagia   [] Weakness or numbness in arms   [] Weakness or numbness in legs Musculoskeletal:  [x] Arthritis   [] Joint swelling   [] Joint pain   [] Low back pain Hematologic:  [] Easy bruising  [] Easy bleeding   [] Hypercoagulable state   [x] Anemic  [] Hepatitis Gastrointestinal:  [] Blood in stool   [] Vomiting blood  [] Gastroesophageal reflux/heartburn   [] Abdominal pain Genitourinary:  [x] Chronic kidney disease   [] Difficult urination  [] Frequent urination  [] Burning with urination   [] Hematuria Skin:  [] Rashes   [] Ulcers   [] Wounds Psychological:  [] History of anxiety   []  History of major depression.    Physical Exam BP (!) 169/79 (BP Location: Left Arm)   Pulse 70   Resp 18   Ht 5\' 5"  (1.651 m)   Wt 199 lb (90.3 kg)   BMI 33.12 kg/m  Gen:  WD/WN, NAD Head: [place]/AT, No temporalis wasting.  Ear/Nose/Throat: Hearing grossly intact, nares w/o erythema or drainage, oropharynx w/o Erythema/Exudate Eyes: Conjunctiva clear, sclera non-icteric  Neck: trachea midline.  No JVD.  Pulmonary:  Good air movement, respirations not labored, no use of accessory muscles  Cardiac: RRR, no JVD Vascular:  Vessel Right Left  Radial Palpable Palpable                                   Gastrointestinal:. No masses, surgical incisions, or scars. Musculoskeletal: M/S 5/5 throughout.  Extremities without ischemic changes.  No deformity or atrophy.  Mild lower extremity edema. Neurologic: Sensation  grossly intact in extremities.  Symmetrical.  Speech is fluent. Motor exam as listed above. Psychiatric: Judgment and insight appear decent.  Not a great historian Dermatologic: No rashes or ulcers noted.  No cellulitis or open wounds.    Radiology ECHOCARDIOGRAM COMPLETE  Result Date: 02/05/2022    ECHOCARDIOGRAM REPORT   Patient Name:  Gretta Arab Date of Exam: 02/05/2022 Medical Rec #:  [id number]  Height:       69.0 in Accession #:    [id number] Weight:       209.0 lb Date of Birth:  1951/03/07  BSA:          2.105 m Patient Age:    81 years   BP:           167/90 mmHg Patient Gender: F          HR:           64 bpm. Exam Location:  [facility] Procedure: 2D Echo, Cardiac Doppler and Color Doppler Indications:     R06.00 Dypnea  History:         Patient has no prior history of Echocardiogram examinations.                  Risk Factors:Dyslipidemia, Hypertension and Diabetes.  Sonographer:     Rosalia Hammers Referring Phys:  [id number] AMY N COX Diagnosing Phys: Nelva Bush MD  Sonographer Comments: Image acquisition challenging due to patient body habitus and Image acquisition challenging due to respiratory motion. IMPRESSIONS  1. Left ventricular ejection fraction, by estimation, is >75%. The left ventricle has hyperdynamic function. The left ventricle has no regional wall motion abnormalities. There is severe left ventricular hypertrophy. Left ventricular diastolic parameters are consistent with Grade I diastolic dysfunction (impaired relaxation). Elevated left atrial pressure.  2. Right ventricular systolic function is normal. The right ventricular size is normal.  3. The mitral valve is abnormal. Trivial mitral valve regurgitation. No evidence of mitral stenosis.  4. The aortic valve has an indeterminant number of cusps. Aortic valve regurgitation is not visualized. No aortic stenosis is present.  5. Aortic dilatation noted. 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  6. The inferior vena cava is normal in size with greater than 50% respiratory variability, suggesting right atrial pressure of 3 mmHg. FINDINGS  Left Ventricle: Left ventricular ejection fraction, by estimation, is >75%. The left ventricle has hyperdynamic function. The left ventricle has no regional wall motion abnormalities. The left ventricular internal cavity size was normal in size. There is severe left ventricular hypertrophy. Left ventricular diastolic parameters are consistent with Grade I diastolic dysfunction (impaired relaxation). Elevated left atrial pressure. Right Ventricle: The right ventricular size is normal. No increase in right ventricular wall thickness. Right ventricular systolic function is normal. Left Atrium: Left atrial size was normal in size. Right Atrium: Right atrial size was normal in size. Pericardium: There is no evidence of pericardial effusion. Mitral Valve: The mitral valve is abnormal. There is mild thickening of the posterior mitral valve leaflet(s). There is mild calcification of the posterior mitral valve leaflet(s). Mild mitral annular calcification. Trivial mitral valve regurgitation. No  evidence of mitral valve stenosis. Tricuspid Valve: The tricuspid valve is normal in structure. Tricuspid valve regurgitation is not demonstrated. Aortic Valve: The aortic valve has an indeterminant number of cusps. Aortic valve regurgitation is not visualized. No aortic stenosis is present. Aortic valve mean gradient measures 6.0 mmHg. Aortic valve peak gradient measures 9.5 mmHg. Aortic valve area, by VTI measures 2.59 cm. Pulmonic Valve: The pulmonic valve was grossly normal. Pulmonic valve regurgitation is not visualized. No evidence of pulmonic stenosis. Aorta: Aortic dilatation noted. 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 Pulmonary Artery: The pulmonary artery is of normal  size. Venous: The inferior vena cava is normal in size  with greater than 50% respiratory variability, suggesting right atrial pressure of 3 mmHg. IAS/Shunts: No atrial level shunt detected by color flow Doppler.  LEFT VENTRICLE PLAX 2D LVIDd:         3.84 cm   Diastology LVIDs:         2.08 cm   LV e' medial:    5.77 cm/s LV PW:         1.99 cm   LV E/e' medial:  18.7 LV IVS:        2.07 cm   LV e' lateral:   5.66 cm/s LVOT diam:     2.00 cm   LV E/e' lateral: 19.1 LV SV:         90 LV SV Index:   43 LVOT Area:     3.14 cm  RIGHT VENTRICLE RV Basal diam:  2.89 cm RV S prime:     14.90 cm/s TAPSE (M-mode): 2.3 cm LEFT ATRIUM             Index        RIGHT ATRIUM           Index LA diam:        3.60 cm 1.71 cm/m   RA Area:     15.30 cm LA Vol (A2C):   66.9 ml 31.78 ml/m  RA Volume:   37.50 ml  17.81 ml/m LA Vol (A4C):   49.7 ml 23.61 ml/m LA Biplane Vol: 59.7 ml 28.36 ml/m  AORTIC VALVE AV Area (Vmax):    2.73 cm AV Area (Vmean):   2.64 cm AV Area (VTI):     2.59 cm AV Vmax:           154.00 cm/s AV Vmean:          110.000 cm/s AV VTI:            0.348 m AV Peak Grad:      9.5 mmHg AV Mean Grad:      6.0 mmHg LVOT Vmax:         134.00 cm/s LVOT Vmean:        92.600 cm/s LVOT VTI:          0.287 m LVOT/AV VTI ratio: 0.82  AORTA Ao Root diam: 4.20 cm MITRAL VALVE MV Area (PHT): 2.33 cm     SHUNTS MV Decel Time: 325 msec     Systemic VTI:  0.29 m MV E velocity: 108.00 cm/s  Systemic Diam: 2.00 cm MV A velocity: 149.00 cm/s MV E/A ratio:  0.72 Harrell Gave End MD Electronically signed by Nelva Bush MD Signature Date/Time: 02/05/2022/3:28:04 PM    Final    DG Chest 2 View  Result Date: 02/04/2022 CLINICAL DATA:  Short of breath EXAM: CHEST - 2 VIEW COMPARISON:  03/02/2021 FINDINGS: Patchy opacities bilaterally, right greater than left. Trace pleural effusions. No pneumothorax. Stable mild cardiomegaly. IMPRESSION: Cardiomegaly, trace pleural effusions, and patchy opacities bilaterally reflecting edema or less likely pneumonia. Electronically Signed   By:  Macy Mis M.D.   On: 02/04/2022 13:10    Labs Recent Results (from the past 2160 hour(s))  Basic metabolic panel     Status: Abnormal   Collection Time: 02/04/22 12:06 PM  Result Value Ref Range   Sodium 141 135 - 145 mmol/L   Potassium 5.1 3.5 - 5.1 mmol/L   Chloride 112 (H) 98 - 111 mmol/L   CO2 19 (L)  22 - 32 mmol/L   Glucose, Bld 117 (H) 70 - 99 mg/dL    Comment: Glucose reference range applies only to samples taken after fasting for at least 8 hours.   BUN 39 (H) 8 - 23 mg/dL   Creatinine, Ser 3.07 (H) 0.44 - 1.00 mg/dL   Calcium 8.9 8.9 - 10.3 mg/dL   GFR, Estimated 16 (L) >60 mL/min    Comment: (NOTE) Calculated using the CKD-EPI Creatinine Equation (2021)    Anion gap 10 5 - 15    Comment: Performed at [facility], [address] [place], [place] [address]  CBC     Status: Abnormal   Collection Time: 02/04/22 12:06 PM  Result Value Ref Range   WBC 13.1 (H) 4.0 - 10.5 K/uL   RBC 3.99 3.87 - 5.11 MIL/uL   Hemoglobin 10.0 (L) 12.0 - 15.0 g/dL   HCT 34.0 (L) 36.0 - 46.0 %   MCV 85.2 80.0 - 100.0 fL   MCH 25.1 (L) 26.0 - 34.0 pg   MCHC 29.4 (L) 30.0 - 36.0 g/dL   RDW 17.0 (H) 11.5 - 15.5 %   Platelets 266 150 - 400 K/uL   nRBC 0.0 0.0 - 0.2 %    Comment: Performed at [facility], [place], [place] [address]  Troponin I (High Sensitivity)     Status: Abnormal   Collection Time: 02/04/22 12:06 PM  Result Value Ref Range   Troponin I (High Sensitivity) 22 (H) <18 ng/L    Comment: (NOTE) Elevated high sensitivity troponin I (hsTnI) values and significant  changes across serial measurements may suggest ACS but many other  chronic and acute conditions are known to elevate hsTnI results.  Refer to the "Links" section for chest pain algorithms and additional  guidance. Performed at [facility], [address] [place], [place] [address]   Brain natriuretic peptide     Status: Abnormal   Collection Time: 02/04/22 12:06  PM  Result Value Ref Range   B Natriuretic Peptide 271.0 (H) 0.0 - 100.0 pg/mL    Comment: Performed at [facility], [place], [place] [address]  Troponin I (High Sensitivity)     Status: Abnormal   Collection Time: 02/04/22  2:52 PM  Result Value Ref Range   Troponin I (High Sensitivity) 22 (H) <18 ng/L    Comment: (NOTE) Elevated high sensitivity troponin I (hsTnI) values and significant  changes across serial measurements may suggest ACS but many other  chronic and acute conditions are known to elevate hsTnI results.  Refer to the "Links" section for chest pain algorithms and additional  guidance. Performed at [facility], [address] [place], [place] [address]   HIV Antibody (routine testing w rflx)     Status: None   Collection Time: 02/04/22  4:59 PM  Result Value Ref Range   HIV Screen 4th Generation wRfx Non Reactive Non Reactive    Comment: Performed at [facility], [place] [address], [place], [place] [address]  Procalcitonin     Status: None   Collection Time: 02/04/22  4:59 PM  Result Value Ref Range   Procalcitonin <0.10 ng/mL    Comment:        Interpretation: PCT (Procalcitonin) <= 0.5 ng/mL: Systemic infection (sepsis) is not likely. Local bacterial infection is possible. (NOTE)       Sepsis PCT Algorithm           Lower Respiratory  Tract                                      Infection PCT Algorithm    ----------------------------     ----------------------------         PCT < 0.25 ng/mL                PCT < 0.10 ng/mL          Strongly encourage             Strongly discourage   discontinuation of antibiotics    initiation of antibiotics    ----------------------------     -----------------------------       PCT 0.25 - 0.50 ng/mL            PCT 0.10 - 0.25 ng/mL               OR       >80% decrease in PCT            Discourage initiation of                                            antibiotics      Encourage  discontinuation           of antibiotics    ----------------------------     -----------------------------         PCT >= 0.50 ng/mL              PCT 0.26 - 0.50 ng/mL               AND        <80% decrease in PCT             Encourage initiation of                                             antibiotics       Encourage continuation           of antibiotics    ----------------------------     -----------------------------        PCT >= 0.50 ng/mL                  PCT > 0.50 ng/mL               AND         increase in PCT                  Strongly encourage                                      initiation of antibiotics    Strongly encourage escalation           of antibiotics                                     -----------------------------  PCT <= 0.25 ng/mL                                                 OR                                        > 80% decrease in PCT                                      Discontinue / Do not initiate                                             antibiotics  Performed at [facility], [address] [place], [place] [address] CORRECTED ON 07/17 AT 1856: PREVIOUSLY REP ORTED AS <0.10        Interpretation: PCT (Procalcitonin) <= 0.5 ng/mL: Systemic infection (sepsis) is not likely. Local bacterial infection is possible., CORRECTED ON 07/17 AT 1856: PREVIOUSLY REPORTED AS <0.10   Phosphorus     Status: None   Collection Time: 02/04/22  4:59 PM  Result Value Ref Range   Phosphorus 3.2 2.5 - 4.6 mg/dL    Comment: Performed at [facility], [address] [place], [place] [address]  Magnesium     Status: None   Collection Time: 02/04/22  4:59 PM  Result Value Ref Range   Magnesium 2.2 1.7 - 2.4 mg/dL    Comment: Performed at [facility], [address], [place], [place] [address]  Urine Culture     Status: None   Collection Time: 02/04/22  5:02 PM   Specimen: Urine, Clean Catch   Result Value Ref Range   Specimen Description      URINE, CLEAN CATCH Performed at [facility], [address], [place], [place] [address]    Special Requests      NONE Performed at [facility], [address] [place], [place] [address]    Culture      NO GROWTH Performed at [facility], [place] [address] [place], [place] [address]    Report Status 02/05/2022 FINAL   Lactic acid, plasma     Status: None   Collection Time: 02/04/22  5:02 PM  Result Value Ref Range   Lactic Acid, Venous 1.0 0.5 - 1.9 mmol/L    Comment: Performed at [facility], [address] [place], [place] [address]  Urinalysis, Complete w Microscopic Urine, Clean Catch     Status: Abnormal   Collection Time: 02/04/22  5:02 PM  Result Value Ref Range   Color, Urine STRAW (A) YELLOW   APPearance CLEAR (A) CLEAR   Specific Gravity, Urine 1.008 1.005 - 1.030   pH 7.0 5.0 - 8.0   Glucose, UA 50 (A) NEGATIVE mg/dL   Hgb urine dipstick SMALL (A) NEGATIVE   Bilirubin Urine NEGATIVE NEGATIVE   Ketones, ur NEGATIVE NEGATIVE mg/dL   Protein, ur 100 (A) NEGATIVE mg/dL   Nitrite NEGATIVE NEGATIVE   Leukocytes,Ua NEGATIVE NEGATIVE   RBC / HPF 0-5 0 - 5  RBC/hpf   WBC, UA 0-5 0 - 5 WBC/hpf   Bacteria, UA NONE SEEN NONE SEEN   Squamous Epithelial / LPF 0-5 0 - 5   Mucus PRESENT     Comment: Performed at [facility], [address] [place], [place] [address]  Culture, blood (Routine X 2) w Reflex to ID Panel     Status: None   Collection Time: 02/04/22  5:15 PM   Specimen: BLOOD  Result Value Ref Range   Specimen Description BLOOD BLOOD LEFT HAND    Special Requests AEROBIC BOTTLE ONLY Blood Culture adequate volume    Culture      NO GROWTH 5 DAYS Performed at [facility], [address] [place], [place] [address]    Report Status 02/09/2022 FINAL   Culture, blood (Routine X 2) w Reflex to ID Panel     Status: None   Collection Time: 02/04/22  5:22  PM   Specimen: BLOOD  Result Value Ref Range   Specimen Description BLOOD LEFT ANTECUBITAL    Special Requests      BOTTLES DRAWN AEROBIC AND ANAEROBIC Blood Culture results may not be optimal due to an excessive volume of blood received in culture bottles   Culture      NO GROWTH 5 DAYS Performed at [facility], [address], [place], [place] [address]    Report Status 02/09/2022 FINAL   CBG monitoring, ED     Status: None   Collection Time: 02/04/22 10:31 PM  Result Value Ref Range   Glucose-Capillary 93 70 - 99 mg/dL    Comment: Glucose reference range applies only to samples taken after fasting for at least 8 hours.  Protime-INR     Status: None   Collection Time: 02/05/22  5:05 AM  Result Value Ref Range   Prothrombin Time 14.1 11.4 - 15.2 seconds   INR 1.1 0.8 - 1.2    Comment: (NOTE) INR goal varies based on device and disease states. Performed at [facility], [address] [place], [place] [address]   Cortisol-am, blood     Status: None   Collection Time: 02/05/22  5:05 AM  Result Value Ref Range   Cortisol - AM 18.1 6.7 - 22.6 ug/dL    Comment: Performed at [facility] [address], [place], [place] [address]  Basic metabolic panel     Status: Abnormal   Collection Time: 02/05/22  5:05 AM  Result Value Ref Range   Sodium 140 135 - 145 mmol/L   Potassium 4.5 3.5 - 5.1 mmol/L   Chloride 112 (H) 98 - 111 mmol/L   CO2 22 22 - 32 mmol/L   Glucose, Bld 62 (L) 70 - 99 mg/dL    Comment: Glucose reference range applies only to samples taken after fasting for at least 8 hours.   BUN 40 (H) 8 - 23 mg/dL   Creatinine, Ser 3.02 (H) 0.44 - 1.00 mg/dL   Calcium 8.5 (L) 8.9 - 10.3 mg/dL   GFR, Estimated 16 (L) >60 mL/min    Comment: (NOTE) Calculated using the CKD-EPI Creatinine Equation (2021)    Anion gap 6 5 - 15    Comment: Performed at [facility], [address] [place],  [address]  CBC     Status: Abnormal    Collection Time: 02/05/22  5:05 AM  Result Value Ref Range   WBC 9.8 4.0 - 10.5 K/uL   RBC 3.54 (L) 3.87 -  5.11 MIL/uL   Hemoglobin 8.9 (L) 12.0 - 15.0 g/dL   HCT 30.1 (L) 36.0 - 46.0 %   MCV 85.0 80.0 - 100.0 fL   MCH 25.1 (L) 26.0 - 34.0 pg   MCHC 29.6 (L) 30.0 - 36.0 g/dL   RDW 16.9 (H) 11.5 - 15.5 %   Platelets 256 150 - 400 K/uL   nRBC 0.0 0.0 - 0.2 %    Comment: Performed at [facility], [address] [place], [place] [address]  Hemoglobin A1c     Status: Abnormal   Collection Time: 02/05/22  5:05 AM  Result Value Ref Range   Hgb A1c MFr Bld 6.7 (H) 4.8 - 5.6 %    Comment: (NOTE) Pre diabetes:          5.7%-6.4%  Diabetes:              >6.4%  Glycemic control for   <7.0% adults with diabetes    Mean Plasma Glucose 145.59 mg/dL    Comment: Performed at [facility] [address], [place], [place] [address]  CBG monitoring, ED     Status: Abnormal   Collection Time: 02/05/22  9:08 AM  Result Value Ref Range   Glucose-Capillary 163 (H) 70 - 99 mg/dL    Comment: Glucose reference range applies only to samples taken after fasting for at least 8 hours.  CBG monitoring, ED     Status: Abnormal   Collection Time: 02/05/22 11:53 AM  Result Value Ref Range   Glucose-Capillary 148 (H) 70 - 99 mg/dL    Comment: Glucose reference range applies only to samples taken after fasting for at least 8 hours.  ECHOCARDIOGRAM COMPLETE     Status: None   Collection Time: 02/05/22  3:07 PM  Result Value Ref Range   Weight 3,344 oz   Height 69 in   BP 167/90 mmHg   Ao pk vel 1.54 m/s   AV Area VTI 2.59 cm2   AR max vel 2.73 cm2   AV Mean grad 6.0 mmHg   AV Peak grad 9.5 mmHg   S' Lateral 2.08 cm   AV Area mean vel 2.64 cm2   Area-P 1/2 2.33 cm2  SARS Coronavirus 2 by RT PCR (hospital order, performed in [place] hospital lab) *cepheid single result test* Anterior Nasal Swab     Status: None   Collection Time: 02/05/22  3:13 PM   Specimen: Anterior Nasal Swab   Result Value Ref Range   SARS Coronavirus 2 by RT PCR NEGATIVE NEGATIVE    Comment: (NOTE) SARS-CoV-2 target nucleic acids are NOT DETECTED.  The SARS-CoV-2 RNA is generally detectable in upper and lower respiratory specimens during the acute phase of infection. The lowest concentration of SARS-CoV-2 viral copies this assay can detect is 250 copies / mL. A negative result does not preclude SARS-CoV-2 infection and should not be used as the sole basis for treatment or other patient management decisions.  A negative result may occur with improper specimen collection / handling, submission of specimen other than nasopharyngeal swab, presence of viral mutation(s) within the areas targeted by this assay, and inadequate number of viral copies (<250 copies / mL). A negative result must be combined with clinical observations, patient history, and epidemiological information.  Fact Sheet for Patients:   https://www.patel.info/  Fact Sheet for Healthcare Providers: https://hall.com/  This test is not yet approved or  cleared by the [place] FDA and has been authorized for  detection and/or diagnosis of SARS-CoV-2 by FDA under an Emergency Use Authorization (EUA).  This EUA will remain in effect (meaning this test can be used) for the duration of the COVID-19 declaration under Section 564(b)(1) of the Act, 21 U.S.C. section 360bbb-3(b)(1), unless the authorization is terminated or revoked sooner.  Performed at [facility], [address] [place], [place] [address]   Glucose, capillary     Status: Abnormal   Collection Time: 02/05/22  5:40 PM  Result Value Ref Range   Glucose-Capillary 110 (H) 70 - 99 mg/dL    Comment: Glucose reference range applies only to samples taken after fasting for at least 8 hours.  Glucose, capillary     Status: Abnormal   Collection Time: 02/05/22  8:36 PM  Result Value Ref Range   Glucose-Capillary  141 (H) 70 - 99 mg/dL    Comment: Glucose reference range applies only to samples taken after fasting for at least 8 hours.  Basic metabolic panel     Status: Abnormal   Collection Time: 02/06/22  4:02 AM  Result Value Ref Range   Sodium 139 135 - 145 mmol/L   Potassium 4.3 3.5 - 5.1 mmol/L   Chloride 109 98 - 111 mmol/L   CO2 22 22 - 32 mmol/L   Glucose, Bld 87 70 - 99 mg/dL    Comment: Glucose reference range applies only to samples taken after fasting for at least 8 hours.   BUN 57 (H) 8 - 23 mg/dL   Creatinine, Ser 3.83 (H) 0.44 - 1.00 mg/dL   Calcium 8.4 (L) 8.9 - 10.3 mg/dL   GFR, Estimated 12 (L) >60 mL/min    Comment: (NOTE) Calculated using the CKD-EPI Creatinine Equation (2021)    Anion gap 8 5 - 15    Comment: Performed at [facility], [address] [place], [place] [address]  Glucose, capillary     Status: None   Collection Time: 02/06/22  7:19 AM  Result Value Ref Range   Glucose-Capillary 73 70 - 99 mg/dL    Comment: Glucose reference range applies only to samples taken after fasting for at least 8 hours.  Glucose, capillary     Status: None   Collection Time: 02/06/22 12:17 PM  Result Value Ref Range   Glucose-Capillary 91 70 - 99 mg/dL    Comment: Glucose reference range applies only to samples taken after fasting for at least 8 hours.  Glucose, capillary     Status: Abnormal   Collection Time: 02/06/22  3:32 PM  Result Value Ref Range   Glucose-Capillary 108 (H) 70 - 99 mg/dL    Comment: Glucose reference range applies only to samples taken after fasting for at least 8 hours.  Glucose, capillary     Status: None   Collection Time: 02/06/22  8:32 PM  Result Value Ref Range   Glucose-Capillary 90 70 - 99 mg/dL    Comment: Glucose reference range applies only to samples taken after fasting for at least 8 hours.  Basic metabolic panel     Status: Abnormal   Collection Time: 02/07/22  6:15 AM  Result Value Ref Range   Sodium 138 135 - 145 mmol/L    Potassium 4.5 3.5 - 5.1 mmol/L   Chloride 109 98 - 111 mmol/L   CO2 22 22 - 32 mmol/L   Glucose, Bld 90 70 - 99 mg/dL    Comment: Glucose reference range applies only to samples taken after fasting for at least  8 hours.   BUN 61 (H) 8 - 23 mg/dL   Creatinine, Ser 4.11 (H) 0.44 - 1.00 mg/dL   Calcium 8.5 (L) 8.9 - 10.3 mg/dL   GFR, Estimated 11 (L) >60 mL/min    Comment: (NOTE) Calculated using the CKD-EPI Creatinine Equation (2021)    Anion gap 7 5 - 15    Comment: Performed at [facility], [address] [place], [place] [address]  CBC     Status: Abnormal   Collection Time: 02/07/22  6:15 AM  Result Value Ref Range   WBC 9.0 4.0 - 10.5 K/uL   RBC 3.68 (L) 3.87 - 5.11 MIL/uL   Hemoglobin 9.4 (L) 12.0 - 15.0 g/dL   HCT 30.5 (L) 36.0 - 46.0 %   MCV 82.9 80.0 - 100.0 fL   MCH 25.5 (L) 26.0 - 34.0 pg   MCHC 30.8 30.0 - 36.0 g/dL   RDW 17.0 (H) 11.5 - 15.5 %   Platelets 268 150 - 400 K/uL   nRBC 0.0 0.0 - 0.2 %    Comment: Performed at [facility], [address] [place], [place] [address]  Glucose, capillary     Status: Abnormal   Collection Time: 02/07/22  7:18 AM  Result Value Ref Range   Glucose-Capillary 102 (H) 70 - 99 mg/dL    Comment: Glucose reference range applies only to samples taken after fasting for at least 8 hours.  Hepatitis B surface antigen     Status: None   Collection Time: 02/07/22  8:45 AM  Result Value Ref Range   Hepatitis B Surface Ag NON REACTIVE NON REACTIVE    Comment: Performed at [facility], [address], [place], [place] [address]  Hepatitis B surface antibody,quantitative     Status: Abnormal   Collection Time: 02/07/22  8:45 AM  Result Value Ref Range   Hep B S AB Quant (Post) <3.1 (L) Immunity>9.9 mIU/mL    Comment: (NOTE)  Status of Immunity                     Anti-HBs Level  ------------------                     -------------- Inconsistent with Immunity                   0.0 - 9.9 Consistent with  Immunity                          >9.9 Performed At: [facility] [address] [place], [place] [id number] Rush Farmer MD [id number]   Hepatitis B core antibody, total     Status: None   Collection Time: 02/07/22  8:45 AM  Result Value Ref Range   Hep B Core Total Ab NON REACTIVE NON REACTIVE    Comment: Performed at [facility] [address] [place], [place] [address]  Hepatitis B e antibody     Status: None   Collection Time: 02/07/22  8:45 AM  Result Value Ref Range   Hep B E Ab Negative Negative    Comment: (NOTE) Performed At: [facility] [place], [place] [id number] Rush Farmer MD [id number]   Glucose, capillary     Status: Abnormal   Collection Time: 02/07/22 11:24 AM  Result Value Ref Range   Glucose-Capillary 125 (H) 70 - 99 mg/dL    Comment: Glucose reference  range applies only to samples taken after fasting for at least 8 hours.  Glucose, capillary     Status: Abnormal   Collection Time: 02/07/22  4:14 PM  Result Value Ref Range   Glucose-Capillary 176 (H) 70 - 99 mg/dL    Comment: Glucose reference range applies only to samples taken after fasting for at least 8 hours.    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  She had normal arterial studies in both upper extremities.  The left upper extremity did not appear to have adequate basilic or cephalic vein for fistula creation.  In the right upper extremity, the basilic vein was marginal.  The cephalic vein is small in the forearm, but did appear to be marginal to adequate in the upper arm.   Given these findings, I would recommend a right arm access creation.  I would explore her antecubital fossa with the intention of placing a  right brachiocephalic AV fistula, but if the cephalic vein is found to be inadequate this time, we would like to place an AV graft she will certainly be on dialysis in the next couple of months.  Risk and benefits are discussed with the patient she is agreeable to proceed.  Diabetes mellitus, type 2 (HCC) blood glucose control important in reducing the progression of atherosclerotic disease. Also, involved in wound healing. On appropriate medications.   Benign hypertension blood pressure control important in reducing the progression of atherosclerotic disease. On appropriate oral medications.   HLD (hyperlipidemia) lipid control important in reducing the progression of atherosclerotic disease. Continue statin therapy      Leotis Pain 03/01/2022, 3:41 PM   This note was created with Dragon medical transcription system.  Any errors from dictation are unintentional.

## 2022-03-01 NOTE — Assessment & Plan Note (Signed)
blood pressure control important in reducing the progression of atherosclerotic disease. On appropriate oral medications.  

## 2022-03-01 NOTE — Assessment & Plan Note (Signed)
The patient has stage V chronic kidney disease and is nearing dialysis dependence and does not yet have a dialysis access that is usable To evaluate her options for dialysis, arterial and venous studies were performed today.  She had normal arterial studies in both upper extremities.  The left upper extremity did not appear to have adequate basilic or cephalic vein for fistula creation.  In the right upper extremity, the basilic vein was marginal.  The cephalic vein is small in the forearm, but did appear to be marginal to adequate in the upper arm.   Given these findings, I would recommend a right arm access creation.  I would explore her antecubital fossa with the intention of placing a right brachiocephalic AV fistula, but if the cephalic vein is found to be inadequate this time, we would like to place an AV graft she will certainly be on dialysis in the next couple of months.  Risk and benefits are discussed with the patient she is agreeable to proceed.

## 2022-03-01 NOTE — Patient Instructions (Signed)
AV Fistula Placement, Care After The following information offers guidance on how to care for yourself after your procedure. Your health care provider may also give you more specific instructions. If you have problems or questions, contact your health care provider. What can I expect after the procedure? After the procedure, it is common to have: Soreness at the fistula site. Vibration (thrill) over the fistula. Follow these instructions at home: Medicines Take over-the-counter and prescription medicines only as told by your health care provider. Ask your health care provider if the medicine prescribed to you can cause constipation. You may need to take these actions to prevent or treat constipation: Drink enough fluid to keep your urine pale yellow. Take over-the-counter or prescription medicines. Eat foods that are high in fiber, such as beans, whole grains, and fresh fruits and vegetables. Limit foods that are high in fat and processed sugars, such as fried or sweet foods. Incision care Follow instructions from your health care provider about how to take care of your incision. Make sure you: Wash your hands with soap and water for at least 20 seconds before and after you change your bandage (dressing). If soap and water are not available, use hand sanitizer. Change your dressing as told by your health care provider. Leave stitches (sutures), skin glue, or adhesive strips in place. These skin closures may need to stay in place for 2 weeks or longer. If adhesive strip edges start to loosen and curl up, you may trim the loose edges. Do not remove adhesive strips completely unless your health care provider tells you to do that.  Fistula care  Check your fistula site every day to make sure the thrill feels the same. Check your fistula site every day for signs of infection. Check for: More redness, swelling, or pain. Fluid or blood. Warmth. Pus or a bad smell. Raise (elevate) the affected  area above the level of your heart while you are sitting or lying down. Do not lift anything that is heavier than 10 lb (4.5 kg), or the limit that you are told, until your health care provider says that it is safe. Do not lie down on your fistula arm. Do not let anyone draw blood or take a blood pressure reading on your fistula arm. This is important. Do not wear tight jewelry or clothing over your fistula arm. Bathing Do not take baths, swim, or use a hot tub until your health care provider approves. Ask your health care provider if you may take showers. You may only be allowed to take sponge baths. Keep the area around your incision clean and dry. General instructions Rest at home for a day or two. If you were given a sedative during the procedure, it can affect you for several hours. Do not drive or operate machinery until your health care provider says that it is safe. Return to your normal activities as told. Ask your health care provider what activities are safe for you. Keep all follow-up visits. This is important. Contact a health care provider if: You have more redness, swelling, or pain around your fistula site. Your fistula site feels warm to the touch. You have pus or a bad smell coming from your fistula site. You have a fever or chills. You feel numb or cold in your arm or your fistula site. You feel a decrease or a change in the thrill over the fistula. Get help right away if: You have bleeding from your fistula site that will not stop.   You have chest pain. You have trouble breathing. These symptoms may represent a serious problem that is an emergency. Do not wait to see if the symptoms will go away. Get medical help right away. Call your local emergency services (911 in the [place] Do not drive yourself to the hospital. Summary Follow instructions from your health care provider about how to take care of your incision. Do not let anyone draw blood or take a blood pressure  reading on your fistula arm. This is important. Contact a health care provider if you have a change in the thrill or have any signs of infection at your fistula site. Keep all follow-up visits. This is important. This information is not intended to replace advice given to you by your health care provider. Make sure you discuss any questions you have with your health care provider. Document Revised: 02/16/2020 Document Reviewed: 02/16/2020 Elsevier Patient Education  2023 Elsevier Inc.  

## 2022-03-06 ENCOUNTER — Telehealth (INDEPENDENT_AMBULATORY_CARE_PROVIDER_SITE_OTHER): Payer: Self-pay

## 2022-03-06 NOTE — Telephone Encounter (Signed)
Spoke with the patient and she is scheduled with Dr. Lucky Cowboy for a right brachial cephalic AVF on 45/36/46 at the [facility]. Pre-op phone call is on 03/11/22 between 1-5 pm. Pre-surgical instructions were discussed and will be mailed.

## 2022-03-11 ENCOUNTER — Other Ambulatory Visit (INDEPENDENT_AMBULATORY_CARE_PROVIDER_SITE_OTHER): Payer: Self-pay | Admitting: Nurse Practitioner

## 2022-03-11 ENCOUNTER — Encounter
Admission: RE | Admit: 2022-03-11 | Discharge: 2022-03-11 | Disposition: A | Discharge status: Home / Self Care | Payer: Medicare HMO | Source: Ambulatory Visit | Attending: Vascular Surgery | Admitting: Vascular Surgery

## 2022-03-11 ENCOUNTER — Encounter: Payer: Self-pay | Admitting: Urgent Care

## 2022-03-11 ENCOUNTER — Encounter: Payer: Self-pay | Admitting: Vascular Surgery

## 2022-03-11 DIAGNOSIS — N185 Chronic kidney disease, stage 5: Secondary | ICD-10-CM

## 2022-03-11 DIAGNOSIS — Z01818 Encounter for other preprocedural examination: Secondary | ICD-10-CM

## 2022-03-11 HISTORY — DX: Aneurysm of the ascending aorta, without rupture: I71.21

## 2022-03-11 HISTORY — DX: End stage renal disease: N18.6

## 2022-03-11 HISTORY — DX: Anemia in chronic kidney disease: N18.9

## 2022-03-11 HISTORY — DX: Type 2 diabetes mellitus without complications: E11.9

## 2022-03-11 HISTORY — DX: Secondary hyperparathyroidism of renal origin: N25.81

## 2022-03-11 HISTORY — DX: Chronic pulmonary edema: J81.1

## 2022-03-11 HISTORY — DX: Pneumonia, unspecified organism: J18.9

## 2022-03-11 HISTORY — DX: Cerebrovascular disease, unspecified: I67.9

## 2022-03-11 HISTORY — DX: Heart failure, unspecified: I50.9

## 2022-03-11 NOTE — Patient Instructions (Addendum)
Your procedure is scheduled on:03-14-22 Thursday Report to the Registration Desk on the 1st floor of the [facility].Then proceed to the 2nd floor Surgery Desk To find out your arrival time, please call [phone] between 1PM - 3PM on:03-13-22 Wednesday If your arrival time is 6:00 am, do not arrive prior to that time as the [facility] entrance doors do not open until 6:00 am.  REMEMBER: Instructions that are not followed completely may result in serious medical risk, up to and including death; or upon the discretion of your surgeon and anesthesiologist your surgery may need to be rescheduled.  Do not eat food after midnight the night before surgery.  No gum chewing, lozengers or hard candies.  You may however, drink Water up to 2 hours before you are scheduled to arrive for your surgery. Do not drink anything within 2 hours of your scheduled arrival time.  TAKE THESE MEDICATIONS THE MORNING OF SURGERY WITH A SIP OF WATER: -amLODipine (NORVASC) -cloNIDine (CATAPRES)  -hydrALAZINE (APRESOLINE) -carvedilol (COREG CR)   Do NOT take your TRESIBA Insulin the morning of surgery   Continue your 81 mg Aspirin up until the day prior to surgery-Do NOT take the day of surgery  One week prior to surgery: Stop Anti-inflammatories (NSAIDS) such as Advil, Aleve, Ibuprofen, Motrin, Naproxen, Naprosyn and Aspirin based products such as Excedrin, Goodys Powder, BC Powder.You may however, take Tylenol if needed for pain up until the day of surgery.  Stop ANY OVER THE COUNTER supplements/vitamins (03-11-22) until after surgery (Fish Oil)  No Alcohol for 24 hours before or after surgery.  No Smoking including e-cigarettes for 24 hours prior to surgery.  No chewable tobacco products for at least 6 hours prior to surgery.  No nicotine patches on the day of surgery.  Do not use any "recreational" drugs for at least a week prior to your surgery.  Please be advised that the combination of cocaine and  anesthesia may have negative outcomes, up to and including death. If you test positive for cocaine, your surgery will be cancelled.  On the morning of surgery brush your teeth with toothpaste and water, you may rinse your mouth with mouthwash if you wish. Do not swallow any toothpaste or mouthwash.  Use CHG Soap as directed on instruction sheet.  Do not wear jewelry, make-up, hairpins, clips or nail polish.  Do not wear lotions, powders, or perfumes.   Do not shave body from the neck down 48 hours prior to surgery just in case you cut yourself which could leave a site for infection.  Also, freshly shaved skin may become irritated if using the CHG soap.  Contact lenses, hearing aids and dentures may not be worn into surgery.  Do not bring valuables to the hospital. [facility] is not responsible for any missing/lost belongings or valuables.   Notify your doctor if there is any change in your medical condition (cold, fever, infection).  Wear comfortable clothing (specific to your surgery type) to the hospital.  After surgery, you can help prevent lung complications by doing breathing exercises.  Take deep breaths and cough every 1-2 hours. Your doctor may order a device called an Incentive Spirometer to help you take deep breaths. When coughing or sneezing, hold a pillow firmly against your incision with both hands. This is called "splinting." Doing this helps protect your incision. It also decreases belly discomfort.  If you are being admitted to the hospital overnight, leave your suitcase in the car. After surgery it  may be brought to your room.  If you are being discharged the day of surgery, you will not be allowed to drive home. You will need a responsible adult (18 years or older) to drive you home and stay with you that night.   If you are taking public transportation, you will need to have a responsible adult (18 years or older) with you. Please confirm with your physician  that it is acceptable to use public transportation.   Please call the [facility] Dept. at [phone] if you have any questions about these instructions.  Surgery Visitation Policy:  Patients undergoing a surgery or procedure may have two family members or support persons with them as long as the person is not COVID-19 positive or experiencing its symptoms.

## 2022-03-12 ENCOUNTER — Telehealth (INDEPENDENT_AMBULATORY_CARE_PROVIDER_SITE_OTHER): Payer: Self-pay

## 2022-03-12 NOTE — Telephone Encounter (Signed)
Spoke with the patient and she has been informed that her right AVF has been rescheduled to 03/21/22 from 03/14/22 with Dr. Lucky Cowboy. Patient stated she understood.

## 2022-03-13 ENCOUNTER — Encounter
Admission: RE | Admit: 2022-03-13 | Discharge: 2022-03-13 | Disposition: A | Discharge status: Home / Self Care | Payer: Medicare HMO | Source: Ambulatory Visit | Attending: Vascular Surgery | Admitting: Vascular Surgery

## 2022-03-13 DIAGNOSIS — N185 Chronic kidney disease, stage 5: Secondary | ICD-10-CM

## 2022-03-13 DIAGNOSIS — Z01812 Encounter for preprocedural laboratory examination: Secondary | ICD-10-CM | POA: Diagnosis present

## 2022-03-13 LAB — TYPE AND SCREEN
ABO/RH(D): O POS
Antibody Screen: NEGATIVE

## 2022-03-20 MED ORDER — CHLORHEXIDINE GLUCONATE CLOTH 2 % EX PADS: 6.0000 | MEDICATED_PAD | Freq: Once | CUTANEOUS | Status: DC

## 2022-03-20 MED ORDER — VANCOMYCIN HCL IN DEXTROSE 1-5 GM/200ML-% IV SOLN: 1000.0000 mg | INTRAVENOUS | Status: DC

## 2022-03-20 MED ORDER — CHLORHEXIDINE GLUCONATE 0.12 % MT SOLN: 15.0000 mL | Freq: Once | OROMUCOSAL | Status: DC

## 2022-03-20 MED ORDER — ORAL CARE MOUTH RINSE: 15.0000 mL | Freq: Once | OROMUCOSAL | Status: DC

## 2022-03-20 MED ORDER — SODIUM CHLORIDE 0.9 % IV SOLN: INTRAVENOUS | Status: DC

## 2022-03-20 MED ORDER — FAMOTIDINE 20 MG PO TABS: 20.0000 mg | ORAL_TABLET | Freq: Once | ORAL | Status: DC

## 2022-03-21 ENCOUNTER — Other Ambulatory Visit: Payer: Self-pay

## 2022-03-21 ENCOUNTER — Encounter: Payer: Self-pay | Admitting: Vascular Surgery

## 2022-03-21 ENCOUNTER — Ambulatory Visit
Admission: RE | Admit: 2022-03-21 | Discharge: 2022-03-21 | Disposition: A | Discharge status: Home / Self Care | Payer: Medicare HMO | Attending: Vascular Surgery | Admitting: Vascular Surgery

## 2022-03-21 ENCOUNTER — Encounter
Admission: RE | Disposition: A | Discharge status: Home / Self Care | Payer: Self-pay | Source: Home / Self Care | Attending: Vascular Surgery

## 2022-03-21 DIAGNOSIS — Z01818 Encounter for other preprocedural examination: Secondary | ICD-10-CM

## 2022-03-21 DIAGNOSIS — Z539 Procedure and treatment not carried out, unspecified reason: Secondary | ICD-10-CM | POA: Diagnosis not present

## 2022-03-21 LAB — POCT I-STAT, CHEM 8
BUN: 37 mg/dL — ABNORMAL HIGH (ref 8–23)
Calcium, Ion: 1.22 mmol/L (ref 1.15–1.40)
Chloride: 111 mmol/L (ref 98–111)
Creatinine, Ser: 3.7 mg/dL — ABNORMAL HIGH (ref 0.44–1.00)
Glucose, Bld: 76 mg/dL (ref 70–99)
HCT: 30 % — ABNORMAL LOW (ref 36.0–46.0)
Hemoglobin: 10.2 g/dL — ABNORMAL LOW (ref 12.0–15.0)
Potassium: 5 mmol/L (ref 3.5–5.1)
Sodium: 142 mmol/L (ref 135–145)
TCO2: 19 mmol/L — ABNORMAL LOW (ref 22–32)

## 2022-03-21 LAB — ABO/RH: ABO/RH(D): O POS

## 2022-03-21 SURGERY — ARTERIOVENOUS (AV) FISTULA CREATION
Anesthesia: General | Laterality: Right

## 2022-03-21 MED ORDER — PROPOFOL 10 MG/ML IV BOLUS
INTRAVENOUS | Status: AC
  Filled 2022-03-21: qty 20

## 2022-03-21 MED ORDER — FAMOTIDINE 20 MG PO TABS
ORAL_TABLET | ORAL | Status: AC
  Filled 2022-03-21: qty 1

## 2022-03-21 MED ORDER — FENTANYL CITRATE (PF) 100 MCG/2ML IJ SOLN
INTRAMUSCULAR | Status: AC
  Filled 2022-03-21: qty 2

## 2022-03-21 MED ORDER — CHLORHEXIDINE GLUCONATE 0.12 % MT SOLN
OROMUCOSAL | Status: AC
  Filled 2022-03-21: qty 15

## 2022-03-21 SURGICAL SUPPLY — 50 items
BAG DECANTER FOR FLEXI CONT (MISCELLANEOUS) ×1 IMPLANT
BLADE SURG SZ11 CARB STEEL (BLADE) ×1 IMPLANT
BOOT SUTURE AID YELLOW STND (SUTURE) ×1 IMPLANT
BRUSH SCRUB EZ  4% CHG (MISCELLANEOUS) ×1
BRUSH SCRUB EZ 4% CHG (MISCELLANEOUS) ×1 IMPLANT
CHLORAPREP W/TINT 26 (MISCELLANEOUS) ×1 IMPLANT
CLIP SPRNG 6MM S-JAW DBL (CLIP) ×1
DERMABOND ADVANCED (GAUZE/BANDAGES/DRESSINGS) ×1
DERMABOND ADVANCED .7 DNX12 (GAUZE/BANDAGES/DRESSINGS) ×1 IMPLANT
ELECT CAUTERY BLADE 6.4 (BLADE) ×1 IMPLANT
ELECT REM PT RETURN 9FT ADLT (ELECTROSURGICAL) ×1
ELECTRODE REM PT RTRN 9FT ADLT (ELECTROSURGICAL) ×1 IMPLANT
GEL ULTRASOUND 20GR AQUASONIC (MISCELLANEOUS) IMPLANT
GLOVE BIO SURGEON STRL SZ7 (GLOVE) ×1 IMPLANT
GOWN STRL REUS W/ TWL LRG LVL3 (GOWN DISPOSABLE) ×1 IMPLANT
GOWN STRL REUS W/ TWL XL LVL3 (GOWN DISPOSABLE) ×2 IMPLANT
GOWN STRL REUS W/TWL LRG LVL3 (GOWN DISPOSABLE) ×1
GOWN STRL REUS W/TWL XL LVL3 (GOWN DISPOSABLE) ×2
HEMOSTAT SURGICEL 2X3 (HEMOSTASIS) ×1 IMPLANT
IV NS 500ML (IV SOLUTION) ×1
IV NS 500ML BAXH (IV SOLUTION) ×1 IMPLANT
KIT TURNOVER KIT A (KITS) ×1 IMPLANT
LABEL OR SOLS (LABEL) ×1 IMPLANT
LOOP RED MAXI  1X406MM (MISCELLANEOUS) ×1
LOOP VESSEL MAXI 1X406 RED (MISCELLANEOUS) ×1 IMPLANT
LOOP VESSEL MINI 0.8X406 BLUE (MISCELLANEOUS) ×1 IMPLANT
LOOPS BLUE MINI 0.8X406MM (MISCELLANEOUS) ×1
MANIFOLD NEPTUNE II (INSTRUMENTS) ×1 IMPLANT
NEEDLE FILTER BLUNT 18X 1/2SAF (NEEDLE) ×1
NEEDLE FILTER BLUNT 18X1 1/2 (NEEDLE) ×1 IMPLANT
NS IRRIG 500ML POUR BTL (IV SOLUTION) ×1 IMPLANT
PACK EXTREMITY ARMC (MISCELLANEOUS) ×1 IMPLANT
PAD PREP 24X41 OB/GYN DISP (PERSONAL CARE ITEMS) ×1 IMPLANT
SOLUTION CELL SAVER (CLIP) ×1 IMPLANT
STOCKINETTE STRL 4IN 9604848 (GAUZE/BANDAGES/DRESSINGS) ×1 IMPLANT
SUT MNCRL AB 4-0 PS2 18 (SUTURE) ×1 IMPLANT
SUT PROLENE 6 0 BV (SUTURE) ×4 IMPLANT
SUT SILK 2 0 (SUTURE) ×1
SUT SILK 2-0 18XBRD TIE 12 (SUTURE) ×1 IMPLANT
SUT SILK 3 0 (SUTURE) ×1
SUT SILK 3-0 18XBRD TIE 12 (SUTURE) ×1 IMPLANT
SUT SILK 4 0 (SUTURE) ×1
SUT SILK 4-0 18XBRD TIE 12 (SUTURE) ×1 IMPLANT
SUT VIC AB 3-0 SH 27 (SUTURE) ×1
SUT VIC AB 3-0 SH 27X BRD (SUTURE) ×1 IMPLANT
SYR 20ML LL LF (SYRINGE) ×1 IMPLANT
SYR 3ML LL SCALE MARK (SYRINGE) ×1 IMPLANT
SYR TB 1ML 27GX1/2 LL (SYRINGE) IMPLANT
TRAP FLUID SMOKE EVACUATOR (MISCELLANEOUS) ×1 IMPLANT
WATER STERILE IRR 500ML POUR (IV SOLUTION) ×1 IMPLANT

## 2022-03-21 NOTE — Progress Notes (Signed)
Pt surgery cancelled due to surgeon's schedule.

## 2022-03-28 ENCOUNTER — Ambulatory Visit: Payer: Medicare HMO | Admitting: Podiatry

## 2022-04-01 ENCOUNTER — Ambulatory Visit (INDEPENDENT_AMBULATORY_CARE_PROVIDER_SITE_OTHER): Payer: Medicare HMO | Admitting: Podiatry

## 2022-04-01 ENCOUNTER — Encounter: Payer: Self-pay | Admitting: Podiatry

## 2022-04-01 DIAGNOSIS — N185 Chronic kidney disease, stage 5: Secondary | ICD-10-CM

## 2022-04-01 DIAGNOSIS — E119 Type 2 diabetes mellitus without complications: Secondary | ICD-10-CM | POA: Diagnosis not present

## 2022-04-01 DIAGNOSIS — M79676 Pain in unspecified toe(s): Secondary | ICD-10-CM | POA: Diagnosis not present

## 2022-04-01 DIAGNOSIS — B351 Tinea unguium: Secondary | ICD-10-CM

## 2022-04-01 NOTE — Progress Notes (Signed)
Complaint:  Visit Type: Patient returns to my office for continued preventative foot care services. Complaint: Patient states" my nails have grown long and thick and become painful to walk and wear shoes" Patient has been diagnosed with DM with neuropathy. The patient presents for preventative foot care services. No changes to ROS.  Patient was hospitalized with Covid and now has left leg swelling.  Podiatric Exam: Vascular: dorsalis pedis and posterior tibial pulses are palpable bilateral. Capillary return is immediate. Temperature gradient is WNL. Skin turgor WNL  Sensorium: Diminished  Semmes Weinstein monofilament test. Normal tactile sensation bilaterally. Nail Exam: Pt has thick disfigured discolored nails with subungual debris especially fifth toenail left foot. Ulcer Exam: There is no evidence of ulcer or pre-ulcerative changes or infection. Orthopedic Exam: Muscle tone and strength are WNL. No limitations in general ROM. No crepitus or effusions noted. Adducto-varus fifth toes  B/L Bony prominences are unremarkable. Skin: No Porokeratosis. No infection or ulcers  Diagnosis:  Onychomycosis   Treatment & Plan Procedures and Treatment: Consent by patient was obtained for treatment procedures. The patient understood the discussion of treatment and procedures well. All questions were answered thoroughly reviewed. Debridement of mycotic and hypertrophic toenails, 1 through 5 bilateral and clearing of subungual debris. No ulceration, no infection noted.  Return Visit-Office Procedure: Patient instructed to return to the office for a follow up visit 3 months for continued evaluation and treatment.    Gardiner Barefoot DPM

## 2022-04-04 ENCOUNTER — Ambulatory Visit: Admission: RE | Admit: 2022-04-04 | Payer: Medicare HMO | Source: Home / Self Care | Admitting: Vascular Surgery

## 2022-04-04 ENCOUNTER — Encounter: Payer: Self-pay | Admitting: Anesthesiology

## 2022-04-04 ENCOUNTER — Encounter: Admission: RE | Payer: Self-pay | Source: Home / Self Care

## 2022-04-04 SURGERY — ARTERIOVENOUS (AV) FISTULA CREATION
Anesthesia: General | Laterality: Right

## 2022-04-04 MED ORDER — VANCOMYCIN HCL IN DEXTROSE 1-5 GM/200ML-% IV SOLN: 1000.0000 mg | INTRAVENOUS | Status: DC

## 2022-04-04 MED ORDER — CHLORHEXIDINE GLUCONATE CLOTH 2 % EX PADS: 6.0000 | MEDICATED_PAD | Freq: Once | CUTANEOUS | Status: DC

## 2022-07-04 ENCOUNTER — Ambulatory Visit: Payer: Medicare HMO | Admitting: Podiatry

## 2022-07-22 DEATH — deceased
